# Patient Record
Sex: Male | Born: 1965
Health system: Southern US, Community
[De-identification: ages and names within clinical notes are randomized; demographics above are authoritative.]

## PROBLEM LIST (undated history)

## (undated) DIAGNOSIS — M5136 Other intervertebral disc degeneration, lumbar region: Secondary | ICD-10-CM

## (undated) DIAGNOSIS — G8929 Other chronic pain: Secondary | ICD-10-CM

## (undated) DIAGNOSIS — M51369 Other intervertebral disc degeneration, lumbar region without mention of lumbar back pain or lower extremity pain: Secondary | ICD-10-CM

## (undated) DIAGNOSIS — M5126 Other intervertebral disc displacement, lumbar region: Secondary | ICD-10-CM

## (undated) DIAGNOSIS — Z87442 Personal history of urinary calculi: Secondary | ICD-10-CM

---

## 2004-01-23 ENCOUNTER — Emergency Department: Payer: Self-pay | Admitting: Internal Medicine

## 2005-09-19 ENCOUNTER — Emergency Department: Payer: Self-pay | Admitting: Emergency Medicine

## 2009-07-03 ENCOUNTER — Emergency Department: Payer: Self-pay | Admitting: Emergency Medicine

## 2009-09-18 ENCOUNTER — Emergency Department: Payer: Self-pay | Admitting: Emergency Medicine

## 2012-07-01 ENCOUNTER — Ambulatory Visit: Payer: Self-pay | Admitting: Family Medicine

## 2012-07-08 ENCOUNTER — Ambulatory Visit: Payer: Self-pay | Admitting: Family Medicine

## 2014-07-25 ENCOUNTER — Ambulatory Visit
Admission: EM | Admit: 2014-07-25 | Discharge: 2014-07-25 | Disposition: A | Discharge status: Home / Self Care | Payer: PRIVATE HEALTH INSURANCE

## 2015-07-07 ENCOUNTER — Ambulatory Visit (INDEPENDENT_AMBULATORY_CARE_PROVIDER_SITE_OTHER): Payer: Self-pay | Admitting: Family Medicine

## 2015-07-07 ENCOUNTER — Encounter: Payer: Self-pay | Admitting: Family Medicine

## 2015-07-07 VITALS — BP 122/80 | HR 77 | Temp 98.3°F | Resp 16 | Ht 70.0 in | Wt 189.0 lb

## 2015-07-07 DIAGNOSIS — J40 Bronchitis, not specified as acute or chronic: Secondary | ICD-10-CM

## 2015-07-07 MED ORDER — AMOXICILLIN 500 MG PO CAPS: 500.0000 mg | ORAL_CAPSULE | Freq: Three times a day (TID) | ORAL | Status: DC

## 2015-07-07 NOTE — Progress Notes (Signed)
Name: Maxwell [name]   MRN: [id number]    DOB: October 21, 1965   Date:07/07/2015       Progress Note  Subjective  Chief Complaint  Chief Complaint  Patient presents with  . Cough    started with swollen and sore throat now congested and PND and sneezing pretty bad since Tuesday. x4 days Had Fever 102 denies NVD    Cough This is a new problem. The current episode started in the past 7 days. The problem has been gradually worsening. The cough is non-productive. Associated symptoms include headaches, nasal congestion, postnasal drip, rhinorrhea and sweats. Pertinent negatives include no chest pain, chills, ear congestion, ear pain, fever, heartburn, hemoptysis, myalgias, rash, sore throat, shortness of breath, weight loss or wheezing. Nothing aggravates the symptoms. He has tried OTC cough suppressant for the symptoms. The treatment provided mild relief. There is no history of asthma, bronchiectasis, bronchitis, COPD or environmental allergies.    No problem-specific assessment & plan notes found for this encounter.   History reviewed. No pertinent past medical history.  History reviewed. No pertinent past surgical history.  Family History  Problem Relation Age of Onset  . Family history unknown: Yes    Social History   Social History  . Marital Status: Married    Spouse Name: N/A  . Number of Children: N/A  . Years of Education: N/A   Occupational History  . Not on file.   Social History Main Topics  . Smoking status: Current Some Day Smoker  . Smokeless tobacco: Never Used  . Alcohol Use: 1.2 oz/week    2 Cans of beer per week  . Drug Use: No  . Sexual Activity: Not on file   Other Topics Concern  . Not on file   Social History Narrative  . No narrative on file    No Known Allergies   Review of Systems  Constitutional: Negative for fever, chills, weight loss and malaise/fatigue.  HENT: Positive for postnasal drip and rhinorrhea. Negative for ear discharge, ear  pain and sore throat.   Eyes: Negative for blurred vision.  Respiratory: Positive for cough. Negative for hemoptysis, sputum production, shortness of breath and wheezing.   Cardiovascular: Negative for chest pain, palpitations and leg swelling.  Gastrointestinal: Negative for heartburn, nausea, abdominal pain, diarrhea, constipation, blood in stool and melena.  Genitourinary: Negative for dysuria, urgency, frequency and hematuria.  Musculoskeletal: Negative for myalgias, back pain, joint pain and neck pain.  Skin: Negative for rash.  Neurological: Positive for headaches. Negative for dizziness, tingling, sensory change and focal weakness.  Endo/Heme/Allergies: Negative for environmental allergies and polydipsia. Does not bruise/bleed easily.  Psychiatric/Behavioral: Negative for depression and suicidal ideas. The patient is not nervous/anxious and does not have insomnia.      Objective  Filed Vitals:   07/07/15 1106  BP: 122/80  Pulse: 77  Temp: 98.3 F (36.8 C)  TempSrc: Oral  Resp: 16  Height:  (1.778 m)  Weight: 189 lb (85.73 kg)  SpO2: 97%    Physical Exam  Constitutional: He is oriented to person, place, and time and well-developed, well-nourished, and in no distress.  HENT:  Head: Normocephalic.  Right Ear: External ear normal.  Left Ear: External ear normal.  Nose: Nose normal.  Mouth/Throat: Oropharynx is clear and moist.  Eyes: Conjunctivae and EOM are normal. Pupils are equal, round, and reactive to light. Right eye exhibits no discharge. Left eye exhibits no discharge. No scleral icterus.  Neck: Normal range of motion.  Neck supple. No JVD present. No tracheal deviation present. No thyromegaly present.  Cardiovascular: Normal rate, regular rhythm, normal heart sounds and intact distal pulses.  Exam reveals no gallop and no friction rub.   No murmur heard. Pulmonary/Chest: Breath sounds normal. No respiratory distress. He has no wheezes. He has no rales.   Abdominal: Soft. Bowel sounds are normal. He exhibits no mass. There is no hepatosplenomegaly. There is no tenderness. There is no rebound, no guarding and no CVA tenderness.  Musculoskeletal: Normal range of motion. He exhibits no edema or tenderness.  Lymphadenopathy:    He has no cervical adenopathy.  Neurological: He is alert and oriented to person, place, and time. He has normal sensation, normal strength, normal reflexes and intact cranial nerves. No cranial nerve deficit.  Skin: Skin is warm. No rash noted.  Psychiatric: Mood and affect normal.  Nursing note and vitals reviewed.     Assessment & Plan  Problem List Items Addressed This Visit    None    Visit Diagnoses    Bronchitis    -  Primary    Relevant Medications    amoxicillin (AMOXIL) 500 MG capsule         Dr. Hayden Rasmusseneanna Marna Weniger [facility]  07/07/2015

## 2016-06-07 ENCOUNTER — Encounter: Payer: Self-pay | Admitting: Family Medicine

## 2016-06-07 ENCOUNTER — Ambulatory Visit (INDEPENDENT_AMBULATORY_CARE_PROVIDER_SITE_OTHER): Payer: BLUE CROSS/BLUE SHIELD | Admitting: Family Medicine

## 2016-06-07 VITALS — BP 100/58 | HR 80 | Ht 70.0 in | Wt 181.0 lb

## 2016-06-07 DIAGNOSIS — R002 Palpitations: Secondary | ICD-10-CM | POA: Diagnosis not present

## 2016-06-07 DIAGNOSIS — R000 Tachycardia, unspecified: Secondary | ICD-10-CM | POA: Diagnosis not present

## 2016-06-07 NOTE — Progress Notes (Addendum)
Name: Maxwell [name]   MRN: [id number]    DOB: 1966/02/09   Date:06/07/2016       Progress Note  Subjective  Chief Complaint  Chief Complaint  Patient presents with  . Palpitations    having a "fluttering heartbeat, started about 3 weeks ago out of the blue"- does not have to be doing anything and it will happen "several times a day"- also having headaches. Has not checked his b/p    Palpitations   This is a new problem. The current episode started 1 to 4 weeks ago. The problem occurs daily. The problem has been waxing and waning. On average, each episode lasts 10 seconds. Nothing aggravates the symptoms. Pertinent negatives include no anxiety, chest fullness, chest pain, coughing, diaphoresis, dizziness, fever, irregular heartbeat, malaise/fatigue, nausea, near-syncope, shortness of breath, syncope or weakness. He has tried nothing for the symptoms. There are no known risk factors. There is no history of anemia, anxiety, heart disease, hyperthyroidism or a valve disorder.    No problem-specific Assessment & Plan notes found for this encounter.   History reviewed. No pertinent past medical history.  History reviewed. No pertinent surgical history.  Family History  Problem Relation Age of Onset  . Family history unknown: Yes    Social History   Social History  . Marital status: Married    Spouse name: N/A  . Number of children: N/A  . Years of education: N/A   Occupational History  . Not on file.   Social History Main Topics  . Smoking status: Current Some Day Smoker    Types: Cigars  . Smokeless tobacco: Current User    Types: Snuff     Comment: counseled concerning meds and patches  . Alcohol use 1.2 oz/week    2 Cans of beer per week  . Drug use: No  . Sexual activity: Not on file   Other Topics Concern  . Not on file   Social History Narrative  . No narrative on file    No Known Allergies  Outpatient Medications Prior to Visit  Medication Sig Dispense  Refill  . amoxicillin (AMOXIL) 500 MG capsule Take 1 capsule (500 mg total) by mouth 3 (three) times daily. 30 capsule 0   No facility-administered medications prior to visit.     Review of Systems  Constitutional: Negative for chills, diaphoresis, fever, malaise/fatigue and weight loss.  HENT: Negative for ear discharge, ear pain and sore throat.   Eyes: Negative for blurred vision.  Respiratory: Negative for cough, sputum production, shortness of breath and wheezing.   Cardiovascular: Positive for palpitations. Negative for chest pain, leg swelling, syncope and near-syncope.  Gastrointestinal: Negative for abdominal pain, blood in stool, constipation, diarrhea, heartburn, melena and nausea.  Genitourinary: Negative for dysuria, frequency, hematuria and urgency.  Musculoskeletal: Negative for back pain, joint pain, myalgias and neck pain.  Skin: Negative for rash.  Neurological: Negative for dizziness, tingling, sensory change, focal weakness, weakness and headaches.  Endo/Heme/Allergies: Negative for environmental allergies and polydipsia. Does not bruise/bleed easily.  Psychiatric/Behavioral: Negative for depression and suicidal ideas. The patient is not nervous/anxious and does not have insomnia.      Objective  Vitals:   06/07/16 1021  BP: (!) 100/58  Pulse: 80  Weight: 181 lb (82.1 kg)  Height:  (1.778 m)    Physical Exam  Constitutional: He is oriented to person, place, and time and well-developed, well-nourished, and in no distress.  HENT:  Head: Normocephalic.  Right Ear:  External ear normal.  Left Ear: External ear normal.  Nose: Nose normal.  Mouth/Throat: Oropharynx is clear and moist.  Eyes: Conjunctivae and EOM are normal. Pupils are equal, round, and reactive to light. Right eye exhibits no discharge. Left eye exhibits no discharge. No scleral icterus.  Neck: Normal range of motion. Neck supple. No JVD present. No tracheal deviation present. No  thyromegaly present.  Cardiovascular: Normal rate, regular rhythm, normal heart sounds and intact distal pulses.  Exam reveals no gallop and no friction rub.   No murmur heard. Pulmonary/Chest: Breath sounds normal. No respiratory distress. He has no wheezes. He has no rales.  Abdominal: Soft. Bowel sounds are normal. He exhibits no mass. There is no hepatosplenomegaly. There is no tenderness. There is no rebound, no guarding and no CVA tenderness.  Musculoskeletal: Normal range of motion. He exhibits no edema or tenderness.  Lymphadenopathy:    He has no cervical adenopathy.  Neurological: He is alert and oriented to person, place, and time. He has normal sensation, normal strength, normal reflexes and intact cranial nerves. No cranial nerve deficit.  Skin: Skin is warm. No rash noted.  Psychiatric: Mood and affect normal.  Nursing note and vitals reviewed.     Assessment & Plan  Problem List Items Addressed This Visit    None    Visit Diagnoses    Heart palpitations    -  Primary   Relevant Orders   EKG 12-Lead (Completed)   Ambulatory referral to Cardiology   TSH   Renal Function Panel   Tachycardia, unspecified       Relevant Orders   Ambulatory referral to Cardiology   TSH   Renal Function Panel      No orders of the defined types were placed in this encounter.     Dr. Hayden [facility]  06/07/16

## 2016-06-10 DIAGNOSIS — R002 Palpitations: Secondary | ICD-10-CM | POA: Diagnosis not present

## 2016-06-12 ENCOUNTER — Other Ambulatory Visit: Payer: Self-pay

## 2016-06-13 ENCOUNTER — Other Ambulatory Visit: Payer: Self-pay

## 2016-06-17 DIAGNOSIS — R002 Palpitations: Secondary | ICD-10-CM | POA: Diagnosis not present

## 2016-10-07 ENCOUNTER — Ambulatory Visit: Payer: Self-pay | Admitting: Family Medicine

## 2016-12-31 ENCOUNTER — Encounter: Payer: Self-pay | Admitting: Family Medicine

## 2016-12-31 ENCOUNTER — Ambulatory Visit: Payer: BLUE CROSS/BLUE SHIELD | Admitting: Family Medicine

## 2016-12-31 VITALS — BP 120/70 | HR 64 | Ht 70.0 in | Wt 179.0 lb

## 2016-12-31 DIAGNOSIS — N529 Male erectile dysfunction, unspecified: Secondary | ICD-10-CM | POA: Diagnosis not present

## 2016-12-31 DIAGNOSIS — M51369 Other intervertebral disc degeneration, lumbar region without mention of lumbar back pain or lower extremity pain: Secondary | ICD-10-CM

## 2016-12-31 DIAGNOSIS — M5136 Other intervertebral disc degeneration, lumbar region: Secondary | ICD-10-CM

## 2016-12-31 MED ORDER — MELOXICAM 15 MG PO TABS: 15.0000 mg | ORAL_TABLET | Freq: Every day | ORAL | 0 refills | Status: DC

## 2016-12-31 MED ORDER — CYCLOBENZAPRINE HCL 10 MG PO TABS: 10.0000 mg | ORAL_TABLET | Freq: Three times a day (TID) | ORAL | 0 refills | Status: DC | PRN

## 2016-12-31 MED ORDER — PREDNISONE 10 MG PO TABS: 10.0000 mg | ORAL_TABLET | Freq: Every day | ORAL | 0 refills | Status: DC

## 2016-12-31 MED ORDER — TADALAFIL 20 MG PO TABS: 20.0000 mg | ORAL_TABLET | Freq: Every day | ORAL | 0 refills | Status: DC | PRN

## 2016-12-31 NOTE — Patient Instructions (Signed)
Herniated Disk A herniated disk, also called a ruptured disk or slipped disk, occurs when a disk in the spine bulges out too far. Between the bones in the spine (vertebrae), there are oval disks that are made of a soft, spongy center that is surrounded by a tough outer ring. The disks connect your vertebrae, help your spine move, and absorb shocks from your movement. When you have a herniated disk, the spongy center of the disk bulges out or breaks through the outer ring. It can press on a nerve between the vertebrae and cause pain. This can occur anywhere in the back or neck area, but the lower back is most commonly affected. What are the causes? This condition may be caused by:  Age-related wear and tear. The spongy centers of spinal disks tend to shrink and dry out with age, which makes them more likely to herniate.  Sudden injury, such as a strain or sprain.  What increases the risk? Aging is the main risk factor for a herniated disk. Other risk factors include:  Being a man who is 30-50 years old.  Frequently doing activities that involve heavy lifting, bending, or twisting.  Frequently driving for long hours at a time.  Not getting enough exercise.  Being overweight.  Smoking.  Having a family history of back problems or herniated disks.  Being pregnant or giving birth.  Having poor nutrition.  Being tall.  What are the signs or symptoms? Symptoms may vary depending on where your herniated disk is located.  A herniated disk in the lower back may cause sharp pain in: ? Part of the arm, leg, hip, or buttocks. ? The back of the lower leg (calf). ? The lower back, spreading down through the leg into the foot (sciatica).  A herniated disk in the neck may cause dizziness and vertigo. It may also cause pain or weakness in: ? The neck. ? The shoulder blades. ? Upper arm, forearm, or fingers.  You may also have muscle weakness. It may be difficult to: ? Lift your leg or  arm. ? Stand on your toes. ? Squeeze tightly with one of your hands.  Other symptoms may include: ? Numbness or tingling in the affected areas of the hands, arms, feet, or legs. ? Inability to control when you urinate or when you have bowel movements. This is a rare but serious sign of a severe herniated disk in the lower back.  How is this diagnosed? This condition may be diagnosed based on:  Your symptoms.  Your medical history.  A physical exam. The exam may include: ? Straight-leg test. You will lie on your back while your health care provider lifts your leg, keeping your knee straight. If you feel pain, you likely have a herniated disk. ? Neurological tests. This includes checking for numbness, reflexes, muscle strength, and posture.  Imaging tests, such as: ? X-rays. ? MRI. ? CT scan. ? Electromyogram (EMG) to check the nerves that control muscles. This test may be used to determine which nerves are affected by your herniated disk.  How is this treated? Treatment for this condition may include:  A short period of rest. This is usually the first treatment. ? You may be on bed rest for up to 2 days, or you may be instructed to stay home and avoid physical activity. ? If you have a herniated disk in your lower back, avoid sitting as much as possible. Sitting increases pressure on the disk.  Medicines. These may   include: ? NSAIDs to help reduce pain and swelling. ? Muscle relaxants to prevent sudden tightening of the back muscles (back spasms). ? Prescription pain medicines, if you have severe pain.  Steroid injections in the area of the herniated disk. This can help reduce pain and swelling.  Physical therapy to strengthen your back muscles.  In many cases, symptoms go away with treatment over a period of days or weeks. You will most likely be free of symptoms after 3-4 months. If other treatments do not help to relieve your symptoms, you may need surgery. Follow these  instructions at home: Medicines  Take over-the-counter and prescription medicines only as told by your health care provider.  Do not drive or use heavy machinery while taking prescription pain medicine. Activity  Rest as directed.  After your rest period: ? Return to your normal activities and gradually begin exercising as told by your health care provider. Ask your health care provider what activities and exercises are safe for you. ? Use good posture. ? Avoid movements that cause pain. ? Do not lift anything that is heavier than 10 lb (4.5 kg) until your health care provider says this is safe. ? Do not sit or stand for long periods of time without changing positions. ? Do not sit for long periods of time without getting up and moving around.  If physical therapy was prescribed, do exercises as instructed.  Aim to strengthen muscles in your back and abdomen with exercises like crunches, swimming, or walking. General instructions  Do not use any products that contain nicotine or tobacco, such as cigarettes and e-cigarettes. These products can delay healing. If you need help quitting, ask your health care provider.  Do not wear high-heeled shoes.  Do not sleep on your belly.  If you are overweight, work with your health care provider to lose weight safely.  To prevent or treat constipation while you are taking prescription pain medicine, your health care provider may recommend that you: ? Drink enough fluid to keep your urine clear or pale yellow. ? Take over-the-counter or prescription medicines. ? Eat foods that are high in fiber, such as fresh fruits and vegetables, whole grains, and beans. ? Limit foods that are high in fat and processed sugars, such as fried and sweet foods.  Keep all follow-up visits as told by your health care provider. This is important. How is this prevented?  Maintain a healthy weight.  Try to avoid stressful situations.  Maintain physical  fitness. Do at least 150 minutes of moderate-intensity exercise each week, such as brisk walking or water aerobics.  When lifting objects: ? Keep your feet at least shoulder-width apart and tighten your abdominal muscles. ? Keep your spine neutral as you bend your knees and hips. It is important to lift using the strength of your legs, not your back. Do not lock your knees straight out. ? Always ask for help to lift heavy or awkward objects. Contact a health care provider if:  You have back pain or neck pain that does not get better after 6 weeks.  You have severe pain in your back, neck, legs, or arms.  You develop numbness, tingling, or weakness in any part of your body. Get help right away if:  You cannot move your arms or legs.  You cannot control when you urinate or have bowel movements.  You feel dizzy or you faint.  You have shortness of breath. This information is not intended to replace advice given   to you by your health care provider. Make sure you discuss any questions you have with your health care provider. Document Released: 01/26/2000 Document Revised: 09/25/2015 Document Reviewed: 09/25/2015 Elsevier Interactive Patient Education  2017 [employer]  

## 2016-12-31 NOTE — Progress Notes (Signed)
Name: Maxwell [name]   MRN: [id number]    DOB: 21-Jan-1966   Date:12/31/2016       Progress Note  Subjective  Chief Complaint  Chief Complaint  Patient presents with  . Back Pain    Back Pain  This is a recurrent problem. The current episode started in the past 7 days. The problem occurs intermittently. The problem has been gradually worsening since onset. The pain is present in the lumbar spine. The quality of the pain is described as aching. The pain does not radiate. The pain is at a severity of 5/10. The pain is moderate. The symptoms are aggravated by bending and twisting (lifting). Stiffness is present in the morning. Pertinent negatives include no abdominal pain, bladder incontinence, bowel incontinence, chest pain, dysuria, fever, headaches, leg pain, numbness, paresis, paresthesias, tingling, weakness or weight loss. Risk factors: weightlifter. He has tried NSAIDs for the symptoms. The treatment provided no relief.    No problem-specific Assessment & Plan notes found for this encounter.   No past medical history on file.  No past surgical history on file.  Family History  Family history unknown: Yes    Social History   Socioeconomic History  . Marital status: Married    Spouse name: Not on file  . Number of children: Not on file  . Years of education: Not on file  . Highest education level: Not on file  Social Needs  . Financial resource strain: Not on file  . Food insecurity - worry: Not on file  . Food insecurity - inability: Not on file  . Transportation needs - medical: Not on file  . Transportation needs - non-medical: Not on file  Occupational History  . Not on file  Tobacco Use  . Smoking status: Current Some Day Smoker    Types: Cigars  . Smokeless tobacco: Current User    Types: Snuff  . Tobacco comment: counseled concerning meds and patches  Substance and Sexual Activity  . Alcohol use: Yes    Alcohol/week: 1.2 oz    Types: 2 Cans of beer per week   . Drug use: No  . Sexual activity: Not on file  Other Topics Concern  . Not on file  Social History Narrative  . Not on file    No Known Allergies  No outpatient medications prior to visit.   No facility-administered medications prior to visit.     Review of Systems  Constitutional: Negative for chills, fever, malaise/fatigue and weight loss.  HENT: Negative for ear discharge, ear pain and sore throat.   Eyes: Negative for blurred vision.  Respiratory: Negative for cough, sputum production, shortness of breath and wheezing.   Cardiovascular: Negative for chest pain, palpitations and leg swelling.  Gastrointestinal: Negative for abdominal pain, blood in stool, bowel incontinence, constipation, diarrhea, heartburn, melena and nausea.  Genitourinary: Negative for bladder incontinence, dysuria, frequency, hematuria and urgency.  Musculoskeletal: Positive for back pain. Negative for joint pain, myalgias and neck pain.  Skin: Negative for rash.  Neurological: Negative for dizziness, tingling, sensory change, focal weakness, weakness, numbness, headaches and paresthesias.  Endo/Heme/Allergies: Negative for environmental allergies and polydipsia. Does not bruise/bleed easily.  Psychiatric/Behavioral: Negative for depression and suicidal ideas. The patient is not nervous/anxious and does not have insomnia.      Objective  Vitals:   12/31/16 1043  BP: 120/70  Pulse: 64  Weight: 179 lb (81.2 kg)  Height: 5\' 10"  (1.778 m)    Physical Exam  Constitutional: He  is oriented to person, place, and time and well-developed, well-nourished, and in no distress.  HENT:  Head: Normocephalic.  Right Ear: External ear normal.  Left Ear: External ear normal.  Nose: Nose normal.  Mouth/Throat: Oropharynx is clear and moist.  Eyes: Conjunctivae and EOM are normal. Pupils are equal, round, and reactive to light. Right eye exhibits no discharge. Left eye exhibits no discharge. No scleral icterus.   Neck: Normal range of motion. Neck supple. No JVD present. No tracheal deviation present. No thyromegaly present.  Cardiovascular: Normal rate, regular rhythm, normal heart sounds and intact distal pulses. Exam reveals no gallop and no friction rub.  No murmur heard. Pulmonary/Chest: Breath sounds normal. No respiratory distress. He has no wheezes. He has no rales.  Abdominal: Soft. Bowel sounds are normal. He exhibits no mass. There is no hepatosplenomegaly. There is no tenderness. There is no rebound, no guarding and no CVA tenderness.  Musculoskeletal: He exhibits no edema.       Lumbar back: He exhibits decreased range of motion, tenderness and spasm. He exhibits no bony tenderness and no swelling.  Lymphadenopathy:    He has no cervical adenopathy.  Neurological: He is alert and oriented to person, place, and time. He has normal sensation, normal strength, normal reflexes and intact cranial nerves. No cranial nerve deficit.  Skin: Skin is warm. No rash noted.  Psychiatric: Mood and affect normal.  Nursing note and vitals reviewed.     Assessment & Plan  Problem List Items Addressed This Visit    None    Visit Diagnoses    Degenerative disc disease, lumbar    -  Primary   Relevant Medications   meloxicam (MOBIC) 15 MG tablet   cyclobenzaprine (FLEXERIL) 10 MG tablet   predniSONE (DELTASONE) 10 MG tablet   Vasculogenic erectile dysfunction, unspecified vasculogenic erectile dysfunction type       Relevant Medications   tadalafil (CIALIS) 20 MG tablet      Meds ordered this encounter  Medications  . meloxicam (MOBIC) 15 MG tablet    Sig: Take 1 tablet (15 mg total) by mouth daily.    Dispense:  30 tablet    Refill:  0  . cyclobenzaprine (FLEXERIL) 10 MG tablet    Sig: Take 1 tablet (10 mg total) by mouth 3 (three) times daily as needed for muscle spasms.    Dispense:  30 tablet    Refill:  0  . predniSONE (DELTASONE) 10 MG tablet    Sig: Take 1 tablet (10 mg total) by  mouth daily with breakfast.    Dispense:  30 tablet    Refill:  0    4,4,4,3,3,3,2,2,2,1,1,1  . tadalafil (CIALIS) 20 MG tablet    Sig: Take 1 tablet (20 mg total) by mouth daily as needed for erectile dysfunction.    Dispense:  5 tablet    Refill:  0      Dr. [facility]anna Jones [facility]  12/31/16

## 2017-01-06 ENCOUNTER — Other Ambulatory Visit: Payer: Self-pay

## 2017-01-16 ENCOUNTER — Other Ambulatory Visit: Payer: Self-pay

## 2017-01-16 DIAGNOSIS — N529 Male erectile dysfunction, unspecified: Secondary | ICD-10-CM

## 2017-01-16 MED ORDER — TADALAFIL 20 MG PO TABS: 20.0000 mg | ORAL_TABLET | Freq: Every day | ORAL | 0 refills | Status: DC | PRN

## 2017-01-17 ENCOUNTER — Other Ambulatory Visit: Payer: Self-pay

## 2017-01-17 DIAGNOSIS — N529 Male erectile dysfunction, unspecified: Secondary | ICD-10-CM

## 2017-01-17 MED ORDER — TADALAFIL 20 MG PO TABS: 20.0000 mg | ORAL_TABLET | Freq: Every day | ORAL | 0 refills | Status: DC | PRN

## 2017-01-21 ENCOUNTER — Other Ambulatory Visit: Payer: Self-pay

## 2017-01-21 DIAGNOSIS — M5136 Other intervertebral disc degeneration, lumbar region: Secondary | ICD-10-CM

## 2017-01-27 ENCOUNTER — Other Ambulatory Visit: Payer: Self-pay | Admitting: Family Medicine

## 2017-01-27 DIAGNOSIS — M5136 Other intervertebral disc degeneration, lumbar region: Secondary | ICD-10-CM

## 2017-01-29 DIAGNOSIS — M545 Low back pain: Secondary | ICD-10-CM | POA: Diagnosis not present

## 2017-01-29 DIAGNOSIS — M5136 Other intervertebral disc degeneration, lumbar region: Secondary | ICD-10-CM | POA: Diagnosis not present

## 2017-01-29 DIAGNOSIS — G8929 Other chronic pain: Secondary | ICD-10-CM | POA: Diagnosis not present

## 2017-01-29 DIAGNOSIS — M51379 Other intervertebral disc degeneration, lumbosacral region without mention of lumbar back pain or lower extremity pain: Secondary | ICD-10-CM | POA: Insufficient documentation

## 2017-01-29 DIAGNOSIS — M5441 Lumbago with sciatica, right side: Secondary | ICD-10-CM | POA: Diagnosis not present

## 2017-01-31 ENCOUNTER — Telehealth: Payer: Self-pay

## 2017-01-31 NOTE — Telephone Encounter (Signed)
Pt called wanting something else for his back pain- [facility] that he needed to try PT for pain management and then if after 3 weeks no success, he would follow up with [facility] for possible MRI. Had to LM on machine due to pt not answering phone.

## 2017-02-06 DIAGNOSIS — M545 Low back pain: Secondary | ICD-10-CM | POA: Diagnosis not present

## 2017-02-06 DIAGNOSIS — M5136 Other intervertebral disc degeneration, lumbar region: Secondary | ICD-10-CM | POA: Diagnosis not present

## 2017-02-06 DIAGNOSIS — G8929 Other chronic pain: Secondary | ICD-10-CM | POA: Diagnosis not present

## 2017-02-06 DIAGNOSIS — R531 Weakness: Secondary | ICD-10-CM | POA: Diagnosis not present

## 2017-02-13 DIAGNOSIS — M545 Low back pain: Secondary | ICD-10-CM | POA: Diagnosis not present

## 2017-02-13 DIAGNOSIS — G8929 Other chronic pain: Secondary | ICD-10-CM | POA: Diagnosis not present

## 2017-02-13 DIAGNOSIS — M5136 Other intervertebral disc degeneration, lumbar region: Secondary | ICD-10-CM | POA: Diagnosis not present

## 2017-02-13 DIAGNOSIS — R531 Weakness: Secondary | ICD-10-CM | POA: Diagnosis not present

## 2017-02-19 DIAGNOSIS — M5441 Lumbago with sciatica, right side: Secondary | ICD-10-CM | POA: Diagnosis not present

## 2017-02-19 DIAGNOSIS — G8929 Other chronic pain: Secondary | ICD-10-CM | POA: Diagnosis not present

## 2017-02-19 DIAGNOSIS — M545 Low back pain: Secondary | ICD-10-CM | POA: Diagnosis not present

## 2017-02-19 DIAGNOSIS — M5136 Other intervertebral disc degeneration, lumbar region: Secondary | ICD-10-CM | POA: Diagnosis not present

## 2017-02-20 ENCOUNTER — Other Ambulatory Visit: Payer: Self-pay | Admitting: Orthopedic Surgery

## 2017-02-20 DIAGNOSIS — M5136 Other intervertebral disc degeneration, lumbar region: Secondary | ICD-10-CM

## 2017-02-20 DIAGNOSIS — G8929 Other chronic pain: Secondary | ICD-10-CM

## 2017-02-20 DIAGNOSIS — M5441 Lumbago with sciatica, right side: Secondary | ICD-10-CM

## 2017-02-20 DIAGNOSIS — M5137 Other intervertebral disc degeneration, lumbosacral region: Secondary | ICD-10-CM

## 2017-02-20 DIAGNOSIS — M5440 Lumbago with sciatica, unspecified side: Principal | ICD-10-CM

## 2017-02-20 DIAGNOSIS — R531 Weakness: Secondary | ICD-10-CM | POA: Diagnosis not present

## 2017-02-20 DIAGNOSIS — M51379 Other intervertebral disc degeneration, lumbosacral region without mention of lumbar back pain or lower extremity pain: Secondary | ICD-10-CM

## 2017-02-20 DIAGNOSIS — M545 Low back pain: Secondary | ICD-10-CM | POA: Diagnosis not present

## 2017-02-28 ENCOUNTER — Ambulatory Visit
Admission: RE | Admit: 2017-02-28 | Discharge: 2017-02-28 | Disposition: A | Discharge status: Home / Self Care | Payer: BLUE CROSS/BLUE SHIELD | Source: Ambulatory Visit | Attending: Orthopedic Surgery | Admitting: Orthopedic Surgery

## 2017-02-28 DIAGNOSIS — M5137 Other intervertebral disc degeneration, lumbosacral region: Secondary | ICD-10-CM

## 2017-02-28 DIAGNOSIS — M545 Low back pain: Secondary | ICD-10-CM | POA: Diagnosis not present

## 2017-02-28 DIAGNOSIS — M48061 Spinal stenosis, lumbar region without neurogenic claudication: Secondary | ICD-10-CM | POA: Insufficient documentation

## 2017-02-28 DIAGNOSIS — M5126 Other intervertebral disc displacement, lumbar region: Secondary | ICD-10-CM | POA: Insufficient documentation

## 2017-02-28 DIAGNOSIS — G8929 Other chronic pain: Secondary | ICD-10-CM | POA: Diagnosis not present

## 2017-02-28 DIAGNOSIS — M5136 Other intervertebral disc degeneration, lumbar region: Secondary | ICD-10-CM

## 2017-02-28 DIAGNOSIS — M5441 Lumbago with sciatica, right side: Secondary | ICD-10-CM

## 2017-02-28 DIAGNOSIS — M5440 Lumbago with sciatica, unspecified side: Secondary | ICD-10-CM

## 2017-03-06 DIAGNOSIS — M5416 Radiculopathy, lumbar region: Secondary | ICD-10-CM | POA: Diagnosis not present

## 2017-04-30 ENCOUNTER — Inpatient Hospital Stay: Admission: RE | Admit: 2017-04-30 | Payer: BLUE CROSS/BLUE SHIELD | Source: Ambulatory Visit

## 2017-05-01 ENCOUNTER — Inpatient Hospital Stay: Admission: RE | Admit: 2017-05-01 | Payer: BLUE CROSS/BLUE SHIELD | Source: Ambulatory Visit

## 2017-05-07 ENCOUNTER — Ambulatory Visit: Admission: RE | Admit: 2017-05-07 | Payer: BLUE CROSS/BLUE SHIELD | Source: Ambulatory Visit | Admitting: Neurosurgery

## 2017-05-07 ENCOUNTER — Encounter: Admission: RE | Payer: Self-pay | Source: Ambulatory Visit

## 2017-05-07 SURGERY — LUMBAR LAMINECTOMY/DECOMPRESSION MICRODISCECTOMY 1 LEVEL
Anesthesia: Choice | Laterality: Right

## 2017-05-12 ENCOUNTER — Other Ambulatory Visit: Payer: Self-pay | Admitting: Family Medicine

## 2017-05-12 DIAGNOSIS — M5136 Other intervertebral disc degeneration, lumbar region: Secondary | ICD-10-CM

## 2017-05-13 ENCOUNTER — Encounter: Payer: Self-pay | Admitting: Family Medicine

## 2017-05-13 ENCOUNTER — Ambulatory Visit: Payer: BLUE CROSS/BLUE SHIELD | Admitting: Family Medicine

## 2017-05-13 VITALS — BP 128/64 | HR 80 | Ht 70.0 in | Wt 193.0 lb

## 2017-05-13 DIAGNOSIS — M940 Chondrocostal junction syndrome [Tietze]: Secondary | ICD-10-CM

## 2017-05-13 DIAGNOSIS — R079 Chest pain, unspecified: Secondary | ICD-10-CM | POA: Diagnosis not present

## 2017-05-13 MED ORDER — MELOXICAM 15 MG PO TABS
15.0000 mg | ORAL_TABLET | Freq: Every day | ORAL | 0 refills | Status: DC
Start: 2017-05-13 — End: 2017-05-28

## 2017-05-13 NOTE — Progress Notes (Signed)
Name: Maxwell [name]   MRN: [id number]    DOB: Mar 01, 1965   Date:05/13/2017       Progress Note  Subjective  Chief Complaint  Chief Complaint  Patient presents with  . Chest Pain    started approx 40 min ago at work- discribed as a sharp, poking pain, 7/10, R) upper chest. Has not taken any meds.     Chest Pain   This is a new problem. The current episode started today. The onset quality is sudden. The problem occurs intermittently (every few minutes). The problem has been waxing and waning (intermitant). The pain is present in the substernal region (right costochondral margin). The pain is at a severity of 5/10. The pain is moderate. The quality of the pain is described as sharp. Associated symptoms include diaphoresis. Pertinent negatives include no abdominal pain, back pain, cough, dizziness, exertional chest pressure, fever, headaches, irregular heartbeat, leg pain, lower extremity edema, malaise/fatigue, nausea, near-syncope, palpitations, shortness of breath or sputum production. The pain is aggravated by nothing. He has tried nothing for the symptoms. Risk factors include smoking/tobacco exposure.  Pertinent negatives for past medical history include no CAD and no MI. Prior diagnostic workup includes echocardiogram.    No problem-specific Assessment & Plan notes found for this encounter.   History reviewed. No pertinent past medical history.  History reviewed. No pertinent surgical history.  Family History  Family history unknown: Yes    Social History   Socioeconomic History  . Marital status: Married    Spouse name: Not on file  . Number of children: Not on file  . Years of education: Not on file  . Highest education level: Not on file  Occupational History  . Not on file  Social Needs  . Financial resource strain: Not on file  . Food insecurity:    Worry: Not on file    Inability: Not on file  . Transportation needs:    Medical: Not on file    Non-medical: Not on  file  Tobacco Use  . Smoking status: Current Some Day Smoker    Types: Cigars  . Smokeless tobacco: Current User    Types: Snuff  . Tobacco comment: counseled concerning meds and patches  Substance and Sexual Activity  . Alcohol use: Yes    Alcohol/week: 1.2 oz    Types: 2 Cans of beer per week  . Drug use: No  . Sexual activity: Not on file  Lifestyle  . Physical activity:    Days per week: Not on file    Minutes per session: Not on file  . Stress: Not on file  Relationships  . Social connections:    Talks on phone: Not on file    Gets together: Not on file    Attends religious service: Not on file    Active member of club or organization: Not on file    Attends meetings of clubs or organizations: Not on file    Relationship status: Not on file  . Intimate partner violence:    Fear of current or ex partner: Not on file    Emotionally abused: Not on file    Physically abused: Not on file    Forced sexual activity: Not on file  Other Topics Concern  . Not on file  Social History Narrative  . Not on file    No Known Allergies  Outpatient Medications Prior to Visit  Medication Sig Dispense Refill  . cyclobenzaprine (FLEXERIL) 10 MG tablet Take 1  tablet (10 mg total) by mouth 3 (three) times daily as needed for muscle spasms. 30 tablet 0  . meloxicam (MOBIC) 15 MG tablet TAKE 1 TABLET BY MOUTH EVERY DAY (Patient not taking: Reported on 04/25/2017) 30 tablet 0  . Multiple Vitamins-Minerals (MULTIVITAMIN PO) Take 1 tablet by mouth daily.    . naproxen sodium (ALEVE) 220 MG tablet Take 440 mg by mouth daily as needed (for pain or headache).    . tadalafil (CIALIS) 20 MG tablet Take 1 tablet (20 mg total) by mouth daily as needed for erectile dysfunction. 18 tablet 0   No facility-administered medications prior to visit.     Review of Systems  Constitutional: Positive for diaphoresis. Negative for chills, fever, malaise/fatigue and weight loss.  HENT: Negative for ear  discharge, ear pain and sore throat.   Eyes: Negative for blurred vision.  Respiratory: Negative for cough, sputum production, shortness of breath and wheezing.   Cardiovascular: Positive for chest pain. Negative for palpitations, leg swelling and near-syncope.  Gastrointestinal: Negative for abdominal pain, blood in stool, constipation, diarrhea, heartburn, melena and nausea.  Genitourinary: Negative for dysuria, frequency, hematuria and urgency.  Musculoskeletal: Negative for back pain, joint pain, myalgias and neck pain.  Skin: Negative for rash.  Neurological: Negative for dizziness, tingling, sensory change, focal weakness and headaches.  Endo/Heme/Allergies: Negative for environmental allergies and polydipsia. Does not bruise/bleed easily.  Psychiatric/Behavioral: Negative for depression and suicidal ideas. The patient is not nervous/anxious and does not have insomnia.      Objective  Vitals:   05/13/17 1629  BP: 128/64  Pulse: 80  Weight: 193 lb (87.5 kg)  Height: 5\' 10"  (1.778 m)    Physical Exam  Constitutional: He is oriented to person, place, and time and well-developed, well-nourished, and in no distress.  HENT:  Head: Normocephalic.  Right Ear: External ear normal.  Left Ear: External ear normal.  Nose: Nose normal.  Mouth/Throat: Oropharynx is clear and moist.  Eyes: Pupils are equal, round, and reactive to light. Conjunctivae and EOM are normal. Right eye exhibits no discharge. Left eye exhibits no discharge. No scleral icterus.  Neck: Normal range of motion. Neck supple. No JVD present. No tracheal deviation present. No thyromegaly present.  Cardiovascular: Normal rate, regular rhythm, normal heart sounds and intact distal pulses. Exam reveals no gallop and no friction rub.  No murmur heard. Pulmonary/Chest: Breath sounds normal. No respiratory distress. He has no wheezes. He has no rales. He exhibits tenderness.  Tenderness along 4th ,5th, and 6th costochondral  margins  Abdominal: Soft. Bowel sounds are normal. He exhibits no mass. There is no hepatosplenomegaly. There is no tenderness. There is no rebound, no guarding and no CVA tenderness.  Musculoskeletal: Normal range of motion. He exhibits no edema or tenderness.  Lymphadenopathy:    He has no cervical adenopathy.  Neurological: He is alert and oriented to person, place, and time. He has normal sensation, normal strength, normal reflexes and intact cranial nerves. No cranial nerve deficit.  Skin: Skin is warm. No rash noted.  Psychiatric: Mood and affect normal.  Nursing note and vitals reviewed.     Assessment & Plan  Problem List Items Addressed This Visit    None    Visit Diagnoses    Chest pain at rest    -  Primary   call 911/to er if persists or increases intensity or duration or change in nature of pain   Relevant Orders   EKG 12-Lead (Completed)  Costochondritis, acute       Relevant Medications   meloxicam (MOBIC) 15 MG tablet      Meds ordered this encounter  Medications  . meloxicam (MOBIC) 15 MG tablet    Sig: Take 1 tablet (15 mg total) by mouth daily.    Dispense:  30 tablet    Refill:  0      Dr. Hayden [facility]  05/13/17

## 2017-05-13 NOTE — Patient Instructions (Signed)
Costochondritis Costochondritis is swelling and irritation (inflammation) of the tissue (cartilage) that connects your ribs to your breastbone (sternum). This causes pain in the front of your chest. The pain usually starts gradually and involves more than one rib. What are the causes? The exact cause of this condition is not always known. It results from stress on the cartilage where your ribs attach to your sternum. The cause of this stress could be:  Chest injury (trauma).  Exercise or activity, such as lifting.  Severe coughing.  What increases the risk? You may be at higher risk for this condition if you:  Are male.  Are 30?51 years old.  Recently started a new exercise or work activity.  Have low levels of vitamin D.  Have a condition that makes you cough frequently.  What are the signs or symptoms? The main symptom of this condition is chest pain. The pain:  Usually starts gradually and can be sharp or dull.  Gets worse with deep breathing, coughing, or exercise.  Gets better with rest.  May be worse when you press on the sternum-rib connection (tenderness).  How is this diagnosed? This condition is diagnosed based on your symptoms, medical history, and a physical exam. Your health care provider will check for tenderness when pressing on your sternum. This is the most important finding. You may also have tests to rule out other causes of chest pain. These may include:  A chest X-ray to check for lung problems.  An electrocardiogram (ECG) to see if you have a heart problem that could be causing the pain.  An imaging scan to rule out a chest or rib fracture.  How is this treated? This condition usually goes away on its own over time. Your health care provider may prescribe an NSAID to reduce pain and inflammation. Your health care provider may also suggest that you:  Rest and avoid activities that make pain worse.  Apply heat or cold to the area to reduce pain  and inflammation.  Do exercises to stretch your chest muscles.  If these treatments do not help, your health care provider may inject a numbing medicine at the sternum-rib connection to help relieve the pain. Follow these instructions at home:  Avoid activities that make pain worse. This includes any activities that use chest, abdominal, and side muscles.  If directed, put ice on the painful area: ? Put ice in a plastic bag. ? Place a towel between your skin and the bag. ? Leave the ice on for 20 minutes, 2-3 times a day.  If directed, apply heat to the affected area as often as told by your health care provider. Use the heat source that your health care provider recommends, such as a moist heat pack or a heating pad. ? Place a towel between your skin and the heat source. ? Leave the heat on for 20-30 minutes. ? Remove the heat if your skin turns bright red. This is especially important if you are unable to feel pain, heat, or cold. You may have a greater risk of getting burned.  Take over-the-counter and prescription medicines only as told by your health care provider.  Return to your normal activities as told by your health care provider. Ask your health care provider what activities are safe for you.  Keep all follow-up visits as told by your health care provider. This is important. Contact a health care provider if:  You have chills or a fever.  Your pain does not go   away or it gets worse.  You have a cough that does not go away (is persistent). Get help right away if:  You have shortness of breath. This information is not intended to replace advice given to you by your health care provider. Make sure you discuss any questions you have with your health care provider. Document Released: 11/07/2004 Document Revised: 08/18/2015 Document Reviewed: 05/24/2015 Elsevier Interactive Patient Education  2018 [employer]   

## 2017-05-19 ENCOUNTER — Other Ambulatory Visit: Payer: Self-pay

## 2017-05-19 ENCOUNTER — Encounter
Admission: RE | Admit: 2017-05-19 | Discharge: 2017-05-19 | Disposition: A | Discharge status: Home / Self Care | Payer: BLUE CROSS/BLUE SHIELD | Source: Ambulatory Visit | Attending: Neurosurgery | Admitting: Neurosurgery

## 2017-05-19 DIAGNOSIS — Z01812 Encounter for preprocedural laboratory examination: Secondary | ICD-10-CM | POA: Diagnosis not present

## 2017-05-19 HISTORY — DX: Other intervertebral disc displacement, lumbar region: M51.26

## 2017-05-19 HISTORY — DX: Other intervertebral disc degeneration, lumbar region: M51.36

## 2017-05-19 HISTORY — DX: Other intervertebral disc degeneration, lumbar region without mention of lumbar back pain or lower extremity pain: M51.369

## 2017-05-19 LAB — BASIC METABOLIC PANEL
ANION GAP: 6 (ref 5–15)
BUN: 15 mg/dL (ref 6–20)
CALCIUM: 9.5 mg/dL (ref 8.9–10.3)
CO2: 26 mmol/L (ref 22–32)
CREATININE: 1.25 mg/dL — AB (ref 0.61–1.24)
Chloride: 106 mmol/L (ref 101–111)
Glucose, Bld: 87 mg/dL (ref 65–99)
Potassium: 3.6 mmol/L (ref 3.5–5.1)
SODIUM: 138 mmol/L (ref 135–145)

## 2017-05-19 LAB — URINALYSIS, COMPLETE (UACMP) WITH MICROSCOPIC
Bacteria, UA: NONE SEEN
Bilirubin Urine: NEGATIVE
GLUCOSE, UA: NEGATIVE mg/dL
Ketones, ur: 5 mg/dL — AB
Leukocytes, UA: NEGATIVE
Nitrite: NEGATIVE
Protein, ur: NEGATIVE mg/dL
SPECIFIC GRAVITY, URINE: 1.024 (ref 1.005–1.030)
Squamous Epithelial / LPF: NONE SEEN
pH: 7 (ref 5.0–8.0)

## 2017-05-19 LAB — CBC
HEMATOCRIT: 49.1 % (ref 40.0–52.0)
Hemoglobin: 16.2 g/dL (ref 13.0–18.0)
MCH: 31.7 pg (ref 26.0–34.0)
MCHC: 33 g/dL (ref 32.0–36.0)
MCV: 95.8 fL (ref 80.0–100.0)
PLATELETS: 327 10*3/uL (ref 150–440)
RBC: 5.12 MIL/uL (ref 4.40–5.90)
RDW: 14 % (ref 11.5–14.5)
WBC: 11.6 10*3/uL — AB (ref 3.8–10.6)

## 2017-05-19 LAB — PROTIME-INR
INR: 0.96
PROTHROMBIN TIME: 12.7 s (ref 11.4–15.2)

## 2017-05-19 LAB — SURGICAL PCR SCREEN
MRSA, PCR: NEGATIVE
Staphylococcus aureus: NEGATIVE

## 2017-05-19 LAB — APTT: APTT: 28 s (ref 24–36)

## 2017-05-19 NOTE — Patient Instructions (Signed)
[facility]: [facility] on the 2nd floor of [facility]. To find out your arrival time, please call [phone] between 1PM - 3PM on: May 27, 2017 TUESDAY  REMEMBER: Instructions that are not followed completely may result in serious medical risk, up to and including death; or upon the discretion of your surgeon and anesthesiologist your surgery may need to be rescheduled.  Do not eat food after midnight the night before your procedure.  No gum chewing, lozengers or hard candies.  You may however, drink CLEAR liquids up to 2 hours before you are scheduled to arrive for your surgery. Do not drink anything within 2 hours of the start of your surgery.  Clear liquids include: - water  - apple juice without pulp - clear gatorade - black coffee or tea (Do NOT add anything to the coffee or tea) Do NOT drink anything that is not on this list.    No Alcohol for 24 hours before or after surgery.  No Smoking including e-cigarettes for 24 hours prior to surgery.  No chewable tobacco products for at least 6 hours prior to surgery.  No nicotine patches on the day of surgery.  On the morning of surgery brush your teeth with toothpaste and water, you may rinse your mouth with mouthwash if you wish. Do not swallow any toothpaste or mouthwash.  Notify your doctor if there is any change in your medical condition (cold, fever, infection).  Do not wear jewelry, make-up, hairpins, clips or nail polish.  Do not wear lotions, powders, or perfumes. You may NOT wear deodorant.  Do not shave 48 hours prior to surgery. Men may shave face and neck.  Contacts and dentures may not be worn into surgery.  Do not bring valuables to the hospital, including drivers license, insurance or credit cards.  [facility] is not responsible for any belongings or valuables.   TAKE THESE MEDICATIONS THE MORNING OF SURGERY: FLEXERIL IF NEEDED  Use CHG  Soap or wipes as directed on instruction sheet.   Stop Anti-inflammatories (NSAIDS) such as MELOXICAM Advil, Aleve, Ibuprofen, Motrin, Naproxen, Naprosyn and Aspirin based products such as Excedrin, Goodys Powder, BC Powder. FOR 7 DAYS BEFORE SURGERY (May take Tylenol or Acetaminophen if needed.)  Stop ANY OVER THE COUNTER supplements until after surgery. (May continue Vitamin D, Vitamin B, and multivitamin.)  Wear comfortable clothing (specific to your surgery type) to the hospital.  Plan for stool softeners for home use.  If you are being admitted to the hospital overnight, leave your suitcase in the car. After surgery it may be brought to your room.  If you are being discharged the day of surgery, you will not be allowed to drive home. You will need a responsible adult to drive you home and stay with you that night.   If you are taking public transportation, you will need to have a responsible adult with you. Please confirm with your physician that it is acceptable to use public transportation.   Please call [phone] if you have any questions about these instructions.

## 2017-05-20 NOTE — Pre-Procedure Instructions (Signed)
FAXED UA TO DR Myer HaffYARBROUGH

## 2017-05-28 ENCOUNTER — Encounter
Admission: RE | Disposition: A | Discharge status: Home / Self Care | Payer: Self-pay | Source: Ambulatory Visit | Attending: Neurosurgery

## 2017-05-28 ENCOUNTER — Ambulatory Visit: Payer: BLUE CROSS/BLUE SHIELD

## 2017-05-28 ENCOUNTER — Ambulatory Visit: Payer: BLUE CROSS/BLUE SHIELD | Admitting: Anesthesiology

## 2017-05-28 ENCOUNTER — Ambulatory Visit
Admission: RE | Admit: 2017-05-28 | Discharge: 2017-05-28 | Disposition: A | Discharge status: Home / Self Care | Payer: BLUE CROSS/BLUE SHIELD | Source: Ambulatory Visit | Attending: Neurosurgery | Admitting: Neurosurgery

## 2017-05-28 ENCOUNTER — Encounter: Payer: Self-pay | Admitting: *Deleted

## 2017-05-28 ENCOUNTER — Other Ambulatory Visit: Payer: Self-pay

## 2017-05-28 DIAGNOSIS — M5116 Intervertebral disc disorders with radiculopathy, lumbar region: Secondary | ICD-10-CM | POA: Diagnosis not present

## 2017-05-28 DIAGNOSIS — Z419 Encounter for procedure for purposes other than remedying health state, unspecified: Secondary | ICD-10-CM

## 2017-05-28 DIAGNOSIS — F17210 Nicotine dependence, cigarettes, uncomplicated: Secondary | ICD-10-CM | POA: Diagnosis not present

## 2017-05-28 DIAGNOSIS — M5416 Radiculopathy, lumbar region: Secondary | ICD-10-CM | POA: Diagnosis not present

## 2017-05-28 DIAGNOSIS — Z981 Arthrodesis status: Secondary | ICD-10-CM | POA: Diagnosis not present

## 2017-05-28 HISTORY — PX: LUMBAR LAMINECTOMY/DECOMPRESSION MICRODISCECTOMY: SHX5026

## 2017-05-28 LAB — TYPE AND SCREEN
ABO/RH(D): A NEG
Antibody Screen: NEGATIVE

## 2017-05-28 LAB — ABO/RH: ABO/RH(D): A NEG

## 2017-05-28 SURGERY — LUMBAR LAMINECTOMY/DECOMPRESSION MICRODISCECTOMY 1 LEVEL
Anesthesia: General | Laterality: Right

## 2017-05-28 MED ORDER — DEXAMETHASONE SODIUM PHOSPHATE 10 MG/ML IJ SOLN
INTRAMUSCULAR | Status: DC | PRN
  Administered 2017-05-28: 10 mg via INTRAVENOUS

## 2017-05-28 MED ORDER — BUPIVACAINE-EPINEPHRINE (PF) 0.5% -1:200000 IJ SOLN
INTRAMUSCULAR | Status: AC
  Filled 2017-05-28: qty 30

## 2017-05-28 MED ORDER — METHYLPREDNISOLONE ACETATE 40 MG/ML IJ SUSP
INTRAMUSCULAR | Status: AC
  Filled 2017-05-28: qty 1

## 2017-05-28 MED ORDER — SUCCINYLCHOLINE CHLORIDE 20 MG/ML IJ SOLN
INTRAMUSCULAR | Status: AC
  Filled 2017-05-28: qty 1

## 2017-05-28 MED ORDER — THROMBIN 5000 UNITS EX SOLR
CUTANEOUS | Status: AC
  Filled 2017-05-28: qty 5000

## 2017-05-28 MED ORDER — LIDOCAINE HCL (CARDIAC) 20 MG/ML IV SOLN
INTRAVENOUS | Status: DC | PRN
  Administered 2017-05-28: 100 mg via INTRAVENOUS

## 2017-05-28 MED ORDER — SODIUM CHLORIDE 0.9 % IR SOLN
Status: DC | PRN
  Administered 2017-05-28: 1000 mL

## 2017-05-28 MED ORDER — CEFAZOLIN SODIUM-DEXTROSE 2-4 GM/100ML-% IV SOLN
2.0000 g | INTRAVENOUS | Status: AC
  Administered 2017-05-28: 2 g via INTRAVENOUS

## 2017-05-28 MED ORDER — SODIUM CHLORIDE 0.9 % IJ SOLN
INTRAMUSCULAR | Status: DC | PRN
  Administered 2017-05-28: 20 mL

## 2017-05-28 MED ORDER — ROCURONIUM BROMIDE 50 MG/5ML IV SOLN
INTRAVENOUS | Status: AC
  Filled 2017-05-28: qty 1

## 2017-05-28 MED ORDER — ACETAMINOPHEN 10 MG/ML IV SOLN
INTRAVENOUS | Status: AC
  Filled 2017-05-28: qty 100

## 2017-05-28 MED ORDER — MIDAZOLAM HCL 2 MG/2ML IJ SOLN
INTRAMUSCULAR | Status: AC
  Filled 2017-05-28: qty 2

## 2017-05-28 MED ORDER — PROPOFOL 10 MG/ML IV BOLUS
INTRAVENOUS | Status: AC
  Filled 2017-05-28: qty 20

## 2017-05-28 MED ORDER — BUPIVACAINE-EPINEPHRINE (PF) 0.5% -1:200000 IJ SOLN
INTRAMUSCULAR | Status: DC | PRN
  Administered 2017-05-28: 5 mL

## 2017-05-28 MED ORDER — SODIUM CHLORIDE FLUSH 0.9 % IV SOLN
INTRAVENOUS | Status: AC
  Filled 2017-05-28: qty 10

## 2017-05-28 MED ORDER — THROMBIN (RECOMBINANT) 5000 UNITS EX SOLR
CUTANEOUS | Status: DC | PRN
  Administered 2017-05-28: 5000 [IU] via TOPICAL

## 2017-05-28 MED ORDER — FENTANYL CITRATE (PF) 100 MCG/2ML IJ SOLN: 25.0000 ug | INTRAMUSCULAR | Status: DC | PRN

## 2017-05-28 MED ORDER — LIDOCAINE HCL (PF) 2 % IJ SOLN
INTRAMUSCULAR | Status: AC
  Filled 2017-05-28: qty 10

## 2017-05-28 MED ORDER — PHENYLEPHRINE HCL 10 MG/ML IJ SOLN
INTRAMUSCULAR | Status: AC
  Filled 2017-05-28: qty 1

## 2017-05-28 MED ORDER — CEFAZOLIN SODIUM-DEXTROSE 2-3 GM-%(50ML) IV SOLR
INTRAVENOUS | Status: AC
  Filled 2017-05-28: qty 50

## 2017-05-28 MED ORDER — ROCURONIUM BROMIDE 100 MG/10ML IV SOLN
INTRAVENOUS | Status: DC | PRN
  Administered 2017-05-28: 50 mg via INTRAVENOUS

## 2017-05-28 MED ORDER — PHENYLEPHRINE HCL 10 MG/ML IJ SOLN
INTRAMUSCULAR | Status: DC | PRN
  Administered 2017-05-28 (×2): 100 ug via INTRAVENOUS

## 2017-05-28 MED ORDER — ONDANSETRON HCL 4 MG/2ML IJ SOLN
INTRAMUSCULAR | Status: DC | PRN
  Administered 2017-05-28: 4 mg via INTRAVENOUS

## 2017-05-28 MED ORDER — ONDANSETRON HCL 4 MG/2ML IJ SOLN
INTRAMUSCULAR | Status: AC
  Filled 2017-05-28: qty 2

## 2017-05-28 MED ORDER — FAMOTIDINE 20 MG PO TABS
ORAL_TABLET | ORAL | Status: AC
  Administered 2017-05-28: 20 mg via ORAL
  Filled 2017-05-28: qty 1

## 2017-05-28 MED ORDER — LACTATED RINGERS IV SOLN
INTRAVENOUS | Status: DC
  Administered 2017-05-28: 07:00:00 via INTRAVENOUS

## 2017-05-28 MED ORDER — BACITRACIN 50000 UNITS IM SOLR
INTRAMUSCULAR | Status: AC
  Filled 2017-05-28: qty 1

## 2017-05-28 MED ORDER — SODIUM CHLORIDE 0.9 % IJ SOLN
INTRAMUSCULAR | Status: AC
  Filled 2017-05-28: qty 50

## 2017-05-28 MED ORDER — KETOROLAC TROMETHAMINE 30 MG/ML IJ SOLN
INTRAMUSCULAR | Status: DC | PRN
  Administered 2017-05-28: 30 mg via INTRAVENOUS

## 2017-05-28 MED ORDER — METHOCARBAMOL 500 MG PO TABS: 500.0000 mg | ORAL_TABLET | Freq: Four times a day (QID) | ORAL | 0 refills | Status: DC | PRN

## 2017-05-28 MED ORDER — ONDANSETRON HCL 4 MG/2ML IJ SOLN: 4.0000 mg | Freq: Once | INTRAMUSCULAR | Status: DC | PRN

## 2017-05-28 MED ORDER — ACETAMINOPHEN 10 MG/ML IV SOLN
INTRAVENOUS | Status: DC | PRN
  Administered 2017-05-28: 1000 mg via INTRAVENOUS

## 2017-05-28 MED ORDER — BUPIVACAINE HCL 0.5 % IJ SOLN
INTRAMUSCULAR | Status: DC | PRN
  Administered 2017-05-28: 30 mL

## 2017-05-28 MED ORDER — DEXAMETHASONE SODIUM PHOSPHATE 10 MG/ML IJ SOLN
INTRAMUSCULAR | Status: AC
  Filled 2017-05-28: qty 1

## 2017-05-28 MED ORDER — KETOROLAC TROMETHAMINE 30 MG/ML IJ SOLN
INTRAMUSCULAR | Status: AC
  Filled 2017-05-28: qty 1

## 2017-05-28 MED ORDER — BUPIVACAINE LIPOSOME 1.3 % IJ SUSP
INTRAMUSCULAR | Status: AC
  Filled 2017-05-28: qty 20

## 2017-05-28 MED ORDER — FENTANYL CITRATE (PF) 100 MCG/2ML IJ SOLN
INTRAMUSCULAR | Status: DC | PRN
  Administered 2017-05-28 (×2): 50 ug via INTRAVENOUS

## 2017-05-28 MED ORDER — OXYCODONE HCL 5 MG PO TABS: 5.0000 mg | ORAL_TABLET | ORAL | 0 refills | Status: DC | PRN

## 2017-05-28 MED ORDER — EPHEDRINE SULFATE 50 MG/ML IJ SOLN
INTRAMUSCULAR | Status: AC
  Filled 2017-05-28: qty 1

## 2017-05-28 MED ORDER — METHYLPREDNISOLONE ACETATE 40 MG/ML IJ SUSP
INTRAMUSCULAR | Status: DC | PRN
  Administered 2017-05-28: 40 mg

## 2017-05-28 MED ORDER — SODIUM CHLORIDE 0.9 % IV SOLN
INTRAVENOUS | Status: DC | PRN
  Administered 2017-05-28: 40 mL

## 2017-05-28 MED ORDER — FAMOTIDINE 20 MG PO TABS
20.0000 mg | ORAL_TABLET | Freq: Once | ORAL | Status: AC
  Administered 2017-05-28: 20 mg via ORAL

## 2017-05-28 MED ORDER — FENTANYL CITRATE (PF) 100 MCG/2ML IJ SOLN
INTRAMUSCULAR | Status: AC
  Filled 2017-05-28: qty 2

## 2017-05-28 MED ORDER — MIDAZOLAM HCL 2 MG/2ML IJ SOLN
INTRAMUSCULAR | Status: DC | PRN
  Administered 2017-05-28: 2 mg via INTRAVENOUS

## 2017-05-28 MED ORDER — PROPOFOL 10 MG/ML IV BOLUS
INTRAVENOUS | Status: DC | PRN
  Administered 2017-05-28: 170 mg via INTRAVENOUS

## 2017-05-28 MED ORDER — BUPIVACAINE HCL (PF) 0.5 % IJ SOLN
INTRAMUSCULAR | Status: AC
  Filled 2017-05-28: qty 30

## 2017-05-28 SURGICAL SUPPLY — 62 items
BLADE BOVIE TIP EXT 4 (BLADE) ×2 IMPLANT
BUR NEURO DRILL SOFT 3.0X3.8M (BURR) ×2 IMPLANT
CANISTER SUCT 1200ML W/VALVE (MISCELLANEOUS) ×4 IMPLANT
CHLORAPREP W/TINT 26ML (MISCELLANEOUS) ×4 IMPLANT
CNTNR SPEC 2.5X3XGRAD LEK (MISCELLANEOUS) ×1
CONT SPEC 4OZ STER OR WHT (MISCELLANEOUS) ×1
CONTAINER SPEC 2.5X3XGRAD LEK (MISCELLANEOUS) ×1 IMPLANT
COUNTER NEEDLE 20/40 LG (NEEDLE) ×2 IMPLANT
COVER LIGHT HANDLE STERIS (MISCELLANEOUS) ×4 IMPLANT
CUP MEDICINE 2OZ PLAST GRAD ST (MISCELLANEOUS) ×4 IMPLANT
DERMABOND ADVANCED (GAUZE/BANDAGES/DRESSINGS) ×1
DERMABOND ADVANCED .7 DNX12 (GAUZE/BANDAGES/DRESSINGS) ×1 IMPLANT
DRAPE C-ARM 42X72 X-RAY (DRAPES) ×4 IMPLANT
DRAPE LAPAROTOMY 100X77 ABD (DRAPES) ×2 IMPLANT
DRAPE MICROSCOPE SPINE 48X150 (DRAPES) ×2 IMPLANT
DRAPE POUCH INSTRU U-SHP 10X18 (DRAPES) ×2 IMPLANT
DRAPE SURG 17X11 SM STRL (DRAPES) ×8 IMPLANT
ELECT CAUTERY BLADE TIP 2.5 (TIP) ×2
ELECT EZSTD 165MM 6.5IN (MISCELLANEOUS)
ELECT REM PT RETURN 9FT ADLT (ELECTROSURGICAL) ×2
ELECTRODE CAUTERY BLDE TIP 2.5 (TIP) ×1 IMPLANT
ELECTRODE EZSTD 165MM 6.5IN (MISCELLANEOUS) IMPLANT
ELECTRODE REM PT RTRN 9FT ADLT (ELECTROSURGICAL) ×1 IMPLANT
EVICEL AIRLESS SPRAY ACCES (MISCELLANEOUS) IMPLANT
FRAME EYE SHIELD (PROTECTIVE WEAR) ×2 IMPLANT
GLOVE BIO SURGEON STRL SZ 6.5 (GLOVE) ×4 IMPLANT
GLOVE BIOGEL PI IND STRL 7.0 (GLOVE) ×2 IMPLANT
GLOVE BIOGEL PI INDICATOR 7.0 (GLOVE) ×2
GLOVE SURG SYN 6.5 ES PF (GLOVE) ×4 IMPLANT
GLOVE SURG SYN 8.5  E (GLOVE) ×3
GLOVE SURG SYN 8.5 E (GLOVE) ×3 IMPLANT
GOWN SRG XL LVL 3 NONREINFORCE (GOWNS) ×1 IMPLANT
GOWN STRL NON-REIN TWL XL LVL3 (GOWNS) ×1
GOWN STRL REUS W/ TWL LRG LVL3 (GOWN DISPOSABLE) ×1 IMPLANT
GOWN STRL REUS W/TWL LRG LVL3 (GOWN DISPOSABLE) ×1
GOWN STRL REUS W/TWL MED LVL3 (GOWN DISPOSABLE) ×2 IMPLANT
GRADUATE 1200CC STRL 31836 (MISCELLANEOUS) ×2 IMPLANT
KIT SPINAL PRONEVIEW (KITS) ×2 IMPLANT
KNIFE BAYONET SHORT DISCETOMY (MISCELLANEOUS) IMPLANT
MARKER SKIN DUAL TIP RULER LAB (MISCELLANEOUS) ×6 IMPLANT
NDL SAFETY ECLIPSE 18X1.5 (NEEDLE) ×1 IMPLANT
NEEDLE HYPO 18GX1.5 SHARP (NEEDLE) ×1
NEEDLE HYPO 22GX1.5 SAFETY (NEEDLE) ×2 IMPLANT
NS IRRIG 1000ML POUR BTL (IV SOLUTION) ×2 IMPLANT
PACK LAMINECTOMY NEURO (CUSTOM PROCEDURE TRAY) ×2 IMPLANT
PAD ARMBOARD 7.5X6 YLW CONV (MISCELLANEOUS) ×2 IMPLANT
PATTIES SURGICAL .5X1.5 (GAUZE/BANDAGES/DRESSINGS) IMPLANT
SPOGE SURGIFLO 8M (HEMOSTASIS) ×1
SPONGE SURGIFLO 8M (HEMOSTASIS) ×1 IMPLANT
SUT DVC VLOC 3-0 CL 6 P-12 (SUTURE) ×2 IMPLANT
SUT NURALON 4 0 TR CR/8 (SUTURE) IMPLANT
SUT VIC AB 0 CT1 27 (SUTURE)
SUT VIC AB 0 CT1 27XCR 8 STRN (SUTURE) IMPLANT
SUT VIC AB 2-0 CT1 18 (SUTURE) ×2 IMPLANT
SUT VICRYL 0 AB UR-6 (SUTURE) ×2 IMPLANT
SYR 10ML LL (SYRINGE) ×2 IMPLANT
SYR 20CC LL (SYRINGE) ×2 IMPLANT
SYR 30ML LL (SYRINGE) ×4 IMPLANT
SYR 3ML LL SCALE MARK (SYRINGE) ×2 IMPLANT
TOWEL OR 17X26 4PK STRL BLUE (TOWEL DISPOSABLE) ×8 IMPLANT
TUBE METRX 18MMX5CM (INSTRUMENTS) ×2 IMPLANT
TUBING CONNECTING 10 (TUBING) ×2 IMPLANT

## 2017-05-28 NOTE — Progress Notes (Signed)
Ancef 2 grams x1 ordered for surgical prophylaxis per consult.  Fulton ReekMatt Carloyn Lahue, PharmD, BCPS  05/28/17 6:32 AM

## 2017-05-28 NOTE — Transfer of Care (Signed)
Immediate Anesthesia Transfer of Care Note  Patient: Maxwell BrookingJohn H [name]  Procedure(s) Performed: LUMBAR LAMINECTOMY/DECOMPRESSION MICRODISCECTOMY 1 LEVEL-L4-5 (Right )  Patient Location: PACU  Anesthesia Type:General  Level of Consciousness: awake  Airway & Oxygen Therapy: Patient Spontanous Breathing  Post-op Assessment: Report given to RN  Post vital signs: stable  Last Vitals:  Vitals Value Taken Time  BP    Temp 36.6 C 05/28/2017  9:25 AM  Pulse 81 05/28/2017  9:25 AM  Resp    SpO2 98 % 05/28/2017  9:25 AM  Vitals shown include unvalidated device data.  Last Pain:  Vitals:   05/28/17 0615  TempSrc: Temporal         Complications: No apparent anesthesia complications

## 2017-05-28 NOTE — Anesthesia Preprocedure Evaluation (Signed)
Anesthesia Evaluation  Patient identified by MRN, date of birth, ID band Patient awake    Reviewed: Allergy & Precautions, NPO status , Patient's Chart, lab work & pertinent test results, reviewed documented beta blocker date and time   Airway Mallampati: III  TM Distance: >3 FB     Dental  (+) Chipped   Pulmonary Current Smoker,           Cardiovascular      Neuro/Psych    GI/Hepatic   Endo/Other    Renal/GU      Musculoskeletal   Abdominal   Peds  Hematology   Anesthesia Other Findings   Reproductive/Obstetrics                             Anesthesia Physical Anesthesia Plan  ASA: II  Anesthesia Plan: General   Post-op Pain Management:    Induction: Intravenous  PONV Risk Score and Plan:   Airway Management Planned: Oral ETT  Additional Equipment:   Intra-op Plan:   Post-operative Plan:   Informed Consent: I have reviewed the patients History and Physical, chart, labs and discussed the procedure including the risks, benefits and alternatives for the proposed anesthesia with the patient or authorized representative who has indicated his/her understanding and acceptance.     Plan Discussed with: CRNA  Anesthesia Plan Comments:         Anesthesia Quick Evaluation

## 2017-05-28 NOTE — Anesthesia Postprocedure Evaluation (Signed)
Anesthesia Post Note  Patient: Maxwell BrookingJohn H [name]  Procedure(s) Performed: LUMBAR LAMINECTOMY/DECOMPRESSION MICRODISCECTOMY 1 LEVEL-L4-5 (Right )  Patient location during evaluation: PACU Anesthesia Type: General Level of consciousness: awake and alert Pain management: pain level controlled Vital Signs Assessment: post-procedure vital signs reviewed and stable Respiratory status: spontaneous breathing, nonlabored ventilation, respiratory function stable and patient connected to nasal cannula oxygen Cardiovascular status: blood pressure returned to baseline and stable Postop Assessment: no apparent nausea or vomiting Anesthetic complications: no     Last Vitals:  Vitals:   05/28/17 1015 05/28/17 1046  BP: 128/83 126/77  Pulse: (!) 58   Resp: 16 16  Temp: (!) 36.3 C   SpO2: 98%     Last Pain:  Vitals:   05/28/17 1015  TempSrc:   PainSc: 0-No pain                 Alp Goldwater S

## 2017-05-28 NOTE — OR Nursing (Signed)
Discharge instructions discussed with pt and wife. Both voice understanding. 

## 2017-05-28 NOTE — OR Nursing (Signed)
Walked in hallway. Tolerated well. Voided without difficulty.

## 2017-05-28 NOTE — Discharge Instructions (Signed)

## 2017-05-28 NOTE — H&P (Signed)
History of Present Illness: 05/28/2017 [name] presents with the same symptoms as below.  He continues to have R>L leg pain.  He failed conservative management.  03/06/2017 [name] is here today with a chief complaint of right sided low back pain, in his buttock and upper posterior thigh  Duration: 1 year, worsening the past few months Quality: poking Severity: 5  Precipitating: aggravated by nothing Modifying factors: made better by heat temporarily Weakness: none Timing: none Bowel/Bladder Dysfunction: none  Conservative measures:  Physical therapy: has tried, helps at the moment, but pain returns within less than an hour  Multimodal medical therapy including regular antiinflammatories: flexeril, meloxicam, ibuprofen, prednisone  Injections: has not tried epidural steroid injections  Past Surgery: denies  Maxwell Maxwell H Hemler Jr. has no symptoms of cervical myelopathy.  The symptoms are causing a significant impact on the patient's life.   Review of Systems:  A 10 point review of systems is negative, except for the pertinent positives and negatives detailed in the HPI. Past Medical History: Past Medical History:  Diagnosis Date  . DDD (degenerative disc disease), lumbar   Past Surgical History: No past surgical history on file.  Allergies: Allergies as of 03/06/2017  . (No Known Allergies)   Medications: Outpatient Encounter Medications as of 03/06/2017  Medication Sig Dispense Refill  . cyclobenzaprine (FLEXERIL) 10 MG tablet Take 1 tablet (10 mg total) by mouth nightly as needed for Muscle spasms May only need to take at nighttime, as may cause drowsiness. 30 tablet 0  . meloxicam (MOBIC) 15 MG tablet Take 1 tablet (15 mg total) by mouth once daily 30 tablet 1   No facility-administered encounter medications on file as of 03/06/2017.   Social History: Social History   Tobacco Use  . Smoking status: Current Some Day Smoker  Packs/day: 0.25  .  Smokeless tobacco: Never Used  Substance Use Topics  . Alcohol use: Yes  Alcohol/week: 0.0 oz  . Drug use: Never   Family Medical History: Family History  Problem Relation Age of Onset  . Lung cancer Father  . No Known Problems Sister  . No Known Problems Brother  . Ulcerative colitis Son   Physical Examination:  Vitals:   05/28/17 0615  BP: 126/84  Pulse: 60  Resp: 18  Temp: (!) 97 F (36.1 C)  SpO2: 100%      General: Patient is well developed, well nourished, calm, collected, and in no apparent distress. Attention to examination is appropriate.  Psychiatric: Patient is non-anxious.  Head: Pupils equal, round, and reactive to light.  ENT: Oral mucosa appears well hydrated.  Neck: Supple. Full range of motion.  Respiratory: Patient is breathing without any difficulty.  Extremities: No edema.  Vascular: Palpable dorsal pedal pulses.  Skin: On exposed skin, there are no abnormal skin lesions.  Heart sounds normal no MRG. Chest Clear to Auscultation Bilaterally.   NEUROLOGICAL:  General: In no acute distress.  Awake, alert, oriented to person, place, and time. Speech is clear and fluent. Fund of knowledge is appropriate.   Cranial Nerves: Pupils equal round and reactive to light. Facial tone is symmetric. Facial sensation is symmetric. Shoulder shrug is symmetric. Tongue protrusion is midline. There is no pronator drift.  ROM of spine: full. Palpation of spine: non tender.   Strength: Side Biceps Triceps Deltoid Interossei Grip Wrist Ext. Wrist Flex.  R 5 5 5 5 5 5 5   L 5 5 5 5 5 5 5    Side Iliopsoas  Quads Hamstring PF DF EHL  R 5 5 5 5 5 5   L 5 5 5 5 5 5    Reflexes are 1+ and symmetric at the biceps, triceps, brachioradialis, patella and achilles. Bilateral upper and lower extremity sensation is intact to light touch. Clonus is not present. Toes are down-going. Gait is normal. No difficulty with tandem gait. Hoffman's is absent.   Medical Decision  Making  Imaging: MRI L spine 02/28/2017 IMPRESSION: 1. Bulky right paracentral disc extrusion at L4-L5 with severe stenosis at the level of the descending right L5 nerve. Mild to moderate spinal stenosis. Mild bilateral L4 foraminal stenosis. 2. Advanced chronic disc and endplate degeneration at L5-S1 with mild foraminal stenosis.  Electronically Signed ByDalbert Batman M.D. On: 02/28/2017 15:35  I have personally reviewed the images and agree with the above interpretation.  Assessment and Plan: [name] is a pleasant 52 y.o. male with right-sided L5 radiculopathy due to large disc herniation at L4-5.   He has failed conservative management.  Plan for R L4-5 microdiscectomy.  Venetia Night MD, [facility] Department of Neurosurgery

## 2017-05-28 NOTE — Anesthesia Procedure Notes (Signed)
Procedure Name: Intubation Date/Time: 05/28/2017 7:37 AM Performed by: Carron Curie, CRNA Pre-anesthesia Checklist: Patient identified, Patient being monitored, Timeout performed, Emergency Drugs available and Suction available Patient Re-evaluated:Patient Re-evaluated prior to induction Oxygen Delivery Method: Circle system utilized Preoxygenation: Pre-oxygenation with 100% oxygen Induction Type: IV induction Ventilation: Mask ventilation without difficulty Laryngoscope Size: Mac and 3 Grade View: Grade I Tube type: Oral Tube size: 7.5 mm Number of attempts: 1 Airway Equipment and Method: Stylet Placement Confirmation: ETT inserted through vocal cords under direct vision,  positive ETCO2 and breath sounds checked- equal and bilateral Secured at: 22 cm Tube secured with: Tape Dental Injury: Teeth and Oropharynx as per pre-operative assessment

## 2017-05-28 NOTE — Op Note (Signed)
Indications: Mr. Lindell is a 52 yo male who presented with lumbar radiculopathy due to disc herniation at L4-5.  He failed conservative management and elected for surgical intervention.  Findings: large disc herniation at L4-5  Preoperative Diagnosis: Lumbar radiculopathy Postoperative Diagnosis: same   EBL: 20 ml IVF: 800 ml Drains: none Disposition: Extubated and Stable to PACU Complications: none  No foley catheter was placed.   Preoperative Note:   Risks of surgery discussed include: infection, bleeding, stroke, coma, death, paralysis, CSF leak, nerve/spinal cord injury, numbness, tingling, weakness, complex regional pain syndrome, recurrent stenosis and/or disc herniation, vascular injury, development of instability, neck/back pain, need for further surgery, persistent symptoms, development of deformity, and the risks of anesthesia. The patient understood these risks and agreed to proceed.  Operative Note:    The patient was then brought from the preoperative center with intravenous access established.  The patient underwent general anesthesia and endotracheal tube intubation, and was then rotated on the [place] rail top where all pressure points were appropriately padded.  The skin was then thoroughly cleansed.  Perioperative antibiotic prophylaxis was administered.  Sterile prep and drapes were then applied and a timeout was then observed.  C-arm was brought into the field under sterile conditions, and the L4-5 disc space identified and marked with an incision on the right 1cm lateral to midline.  Once this was complete a 2 cm incision was opened with the use of a #10 blade knife.  The metrics tubes were sequentially advanced under lateral fluoroscopy until a 18 x 50 mm Metrix tube was placed over the facet and lamina and secured to the bed.    The microscope was then sterilely brought into the field and muscle creep was hemostased with a bipolar and resected with a pituitary  rongeur.  A Bovie extender was then used to expose the spinous process and lamina.  Careful attention was placed to not violate the facet capsule. A 3 mm matchstick drill bit was then used to make a hemi-laminotomy trough until the ligamentum flavum was exposed.  This was extended to the base of the spinous process.  Once this was complete and the underlying ligamentum flavum was visualized this was dissected with an up angle curette and resected with a #2 and #3 mm biting Kerrison.  The laminotomy opening was also expanded in similar fashion and hemostasis was obtained with Surgifoam and a patty as well as bone wax.  The rostral aspect of the caudal level of the lamina was also resected with a #2 biting Kerrison effort to further enhance exposure.  Once the underlying dura was visualized a Penfield 4 was then used to dissect and expose the traversing nerve root.  Once this was identified a nerve root retractor suction was used to mobilize this medially.  The venous plexus was hemostased with Surgifoam and light bipolar use.  An annulotomy defect was noted, and disc space contents were noted to to come through the annulotomy.    The disc herniation was identified and dissected free using a balltip probe. The pituitary rongeur was used to remove the extruded disc fragments. Once the thecal sac and nerve root were noted to be relaxed and under less tension the ball-tipped feeler was passed along the foramen distally to to ensure no residual compression was noted.    A Depo-Medrol soaked Gelfoam pledget was placed along the nerve root for 2 minutes and removed.  The area was irrigated. The tube system was then removed under microscopic  visualization and hemostasis was obtained with a bipolar.    The fascial layer was reapproximated with the use of a 0- Vicryl suture.  Subcutaneous tissue layer was reapproximated using 2-0 Vicryl suture.  3-0 monocryl was used on the skin. The skin was then cleansed and Dermabond  was used to close the skin opening.  Patient was then rotated back to the preoperative bed awakened from anesthesia and taken to recovery all counts are correct in this case.   I performed the entire procedure with the assistance of Amanda Ferri PA as an Designer, televisioIvar Drapen/film setassistant surgeon.  Venetia Nighthester Shynice Sigel MD

## 2017-05-28 NOTE — Anesthesia Post-op Follow-up Note (Signed)
Anesthesia QCDR form completed.        

## 2017-05-28 NOTE — Discharge Summary (Signed)
Procedure: L4/L5 decompression Procedure date: 05/28/2017 Diagnosis: Right lumbar radiculopathy  History: Maxwell [name] is here for L4/5 decompression for lumbar radiculopathy. Tolerated procedure without issue. Seen in recovery, sleepy from anesthesia. Answers questions and converses appropriately. Denies pain in back and legs. States continued numbness, but denies new, additional numbness in lower extremities.   Physical Exam: Vitals:   05/28/17 0615  BP: 126/84  Pulse: 60  Resp: 18  Temp: (!) 97 F (36.1 C)  SpO2: 100%    AA Ox3 CNI Strength:5/5 throughout Sensation: intact and symmetric  Assessment/Plan:  Maxwell [name] is s/p L4/L5 decompression for right lumbar radiculopathy.  Pain will continue to be controlled with tylenol, NSAID, robaxin, and oxycodone as needed. Discharge instructions provided. 2 week follow up appointment in clinic already scheduled.   Ivar DrapeAmanda Desarea Ohagan PA-C Department of Neurosurgery

## 2017-06-08 ENCOUNTER — Other Ambulatory Visit: Payer: Self-pay | Admitting: Family Medicine

## 2017-06-08 DIAGNOSIS — M940 Chondrocostal junction syndrome [Tietze]: Secondary | ICD-10-CM

## 2017-07-24 ENCOUNTER — Other Ambulatory Visit: Payer: Self-pay

## 2017-07-24 ENCOUNTER — Other Ambulatory Visit: Payer: Self-pay | Admitting: Family Medicine

## 2017-07-24 DIAGNOSIS — N529 Male erectile dysfunction, unspecified: Secondary | ICD-10-CM

## 2017-09-09 DIAGNOSIS — M545 Low back pain: Secondary | ICD-10-CM | POA: Diagnosis not present

## 2017-10-01 DIAGNOSIS — G8929 Other chronic pain: Secondary | ICD-10-CM | POA: Diagnosis not present

## 2017-10-01 DIAGNOSIS — R531 Weakness: Secondary | ICD-10-CM | POA: Diagnosis not present

## 2017-10-01 DIAGNOSIS — M545 Low back pain: Secondary | ICD-10-CM | POA: Diagnosis not present

## 2017-10-08 DIAGNOSIS — R531 Weakness: Secondary | ICD-10-CM | POA: Diagnosis not present

## 2017-10-08 DIAGNOSIS — M545 Low back pain: Secondary | ICD-10-CM | POA: Diagnosis not present

## 2017-10-08 DIAGNOSIS — G8929 Other chronic pain: Secondary | ICD-10-CM | POA: Diagnosis not present

## 2017-10-16 DIAGNOSIS — R531 Weakness: Secondary | ICD-10-CM | POA: Diagnosis not present

## 2017-10-16 DIAGNOSIS — G8929 Other chronic pain: Secondary | ICD-10-CM | POA: Diagnosis not present

## 2017-10-16 DIAGNOSIS — M545 Low back pain: Secondary | ICD-10-CM | POA: Diagnosis not present

## 2017-10-22 DIAGNOSIS — M545 Low back pain: Secondary | ICD-10-CM | POA: Diagnosis not present

## 2017-10-22 DIAGNOSIS — M5136 Other intervertebral disc degeneration, lumbar region: Secondary | ICD-10-CM | POA: Diagnosis not present

## 2017-10-22 DIAGNOSIS — G8929 Other chronic pain: Secondary | ICD-10-CM | POA: Diagnosis not present

## 2017-10-22 DIAGNOSIS — R531 Weakness: Secondary | ICD-10-CM | POA: Diagnosis not present

## 2017-10-27 ENCOUNTER — Other Ambulatory Visit: Payer: Self-pay | Admitting: Family Medicine

## 2017-10-27 DIAGNOSIS — N529 Male erectile dysfunction, unspecified: Secondary | ICD-10-CM

## 2017-10-30 DIAGNOSIS — M5136 Other intervertebral disc degeneration, lumbar region: Secondary | ICD-10-CM | POA: Diagnosis not present

## 2017-10-30 DIAGNOSIS — M545 Low back pain: Secondary | ICD-10-CM | POA: Diagnosis not present

## 2017-10-30 DIAGNOSIS — R531 Weakness: Secondary | ICD-10-CM | POA: Diagnosis not present

## 2017-10-30 DIAGNOSIS — G8929 Other chronic pain: Secondary | ICD-10-CM | POA: Diagnosis not present

## 2017-10-31 ENCOUNTER — Ambulatory Visit: Payer: BLUE CROSS/BLUE SHIELD | Admitting: Family Medicine

## 2017-10-31 ENCOUNTER — Encounter: Payer: Self-pay | Admitting: Family Medicine

## 2017-10-31 VITALS — BP 120/80 | HR 60 | Ht 70.0 in | Wt 189.0 lb

## 2017-10-31 DIAGNOSIS — N529 Male erectile dysfunction, unspecified: Secondary | ICD-10-CM

## 2017-10-31 MED ORDER — TADALAFIL 20 MG PO TABS: ORAL_TABLET | ORAL | 1 refills | Status: DC

## 2017-10-31 MED ORDER — TADALAFIL 20 MG PO TABS: ORAL_TABLET | ORAL | 3 refills | Status: DC

## 2017-10-31 NOTE — Progress Notes (Signed)
Date:  10/31/2017   Name:  Maxwell [name]   DOB:  11-23-1965   MRN:  [id number]   Chief Complaint: Erectile Dysfunction Erectile Dysfunction  This is a chronic problem. The current episode started more than 1 year ago. The problem is unchanged. The nature of his difficulty is achieving erection and maintaining erection. He reports no anxiety, decreased libido or performance anxiety. Irritative symptoms do not include frequency, nocturia or urgency. Obstructive symptoms do not include dribbling, incomplete emptying, an intermittent stream, a slower stream, straining or a weak stream. Pertinent negatives include no chills, dysuria, genital pain, hematuria, hesitancy or inability to urinate. Nothing aggravates the symptoms. Past treatments include sildenafil. The treatment provided moderate relief.     Review of Systems  Constitutional: Negative for chills and fever.  HENT: Negative for drooling, ear discharge, ear pain and sore throat.   Respiratory: Negative for cough, shortness of breath and wheezing.   Cardiovascular: Negative for chest pain, palpitations and leg swelling.  Gastrointestinal: Negative for abdominal pain, blood in stool, constipation, diarrhea and nausea.  Endocrine: Negative for polydipsia.  Genitourinary: Negative for decreased libido, dysuria, frequency, hematuria, hesitancy, incomplete emptying, nocturia and urgency.  Musculoskeletal: Negative for back pain, myalgias and neck pain.  Skin: Negative for rash.  Allergic/Immunologic: Negative for environmental allergies.  Neurological: Negative for dizziness and headaches.  Hematological: Does not bruise/bleed easily.  Psychiatric/Behavioral: Negative for suicidal ideas. The patient is not nervous/anxious.     There are no active problems to display for this patient.   No Known Allergies  Past Surgical History:  Procedure Laterality Date  . LUMBAR LAMINECTOMY/DECOMPRESSION MICRODISCECTOMY Right 05/28/2017   Procedure: LUMBAR LAMINECTOMY/DECOMPRESSION MICRODISCECTOMY 1 LEVEL-L4-5;  Surgeon: Venetia NightYarbrough, Chester, MD;  Location: [facility] ORS;  Service: Neurosurgery;  Laterality: Right;    Social History   Tobacco Use  . Smoking status: Current Some Day Smoker    Types: Cigars  . Smokeless tobacco: Former NeurosurgeonUser    Types: Snuff  . Tobacco comment: counseled concerning meds and patches  Substance Use Topics  . Alcohol use: Yes    Alcohol/week: 2.0 standard drinks    Types: 2 Cans of beer per week  . Drug use: No     Medication list has been reviewed and updated.  Current Meds  Medication Sig  . Multiple Vitamins-Minerals (MULTIVITAMIN PO) Take 1 tablet by mouth daily.  . tadalafil (ADCIRCA/CIALIS) 20 MG tablet TAKE 1 TABLET DAILY AS NEEDED FOR ERECTILE DYSFUNCTION  . [DISCONTINUED] tadalafil (ADCIRCA/CIALIS) 20 MG tablet TAKE 1 TABLET DAILY AS NEEDED FOR ERECTILE DYSFUNCTION  . [DISCONTINUED] tadalafil (ADCIRCA/CIALIS) 20 MG tablet TAKE 1 TABLET DAILY AS NEEDED FOR ERECTILE DYSFUNCTION    PHQ 2/9 Scores 05/13/2017 07/07/2015  PHQ - 2 Score 0 0  PHQ- 9 Score 2 -    Physical Exam  Constitutional: He is oriented to person, place, and time.  HENT:  Head: Normocephalic.  Right Ear: External ear normal.  Left Ear: External ear normal.  Nose: Nose normal.  Mouth/Throat: Oropharynx is clear and moist.  Eyes: Pupils are equal, round, and reactive to light. Conjunctivae and EOM are normal. Right eye exhibits no discharge. Left eye exhibits no discharge. No scleral icterus.  Neck: Normal range of motion. Neck supple. No JVD present. No tracheal deviation present. No thyromegaly present.  Cardiovascular: Normal rate, regular rhythm, normal heart sounds and intact distal pulses. Exam reveals no gallop and no friction rub.  No murmur heard. Pulmonary/Chest: Breath sounds normal. No respiratory  distress. He has no wheezes. He has no rales.  Abdominal: Soft. Bowel sounds are normal. He exhibits no mass.  There is no hepatosplenomegaly. There is no tenderness. There is no rebound, no guarding and no CVA tenderness.  Musculoskeletal: Normal range of motion. He exhibits no edema or tenderness.  Lymphadenopathy:    He has no cervical adenopathy.  Neurological: He is alert and oriented to person, place, and time. He has normal strength and normal reflexes. No cranial nerve deficit.  Skin: Skin is warm. No rash noted.  Nursing note and vitals reviewed.   BP 120/80   Pulse 60   Ht 5\' 10"  (1.778 m)   Wt 189 lb (85.7 kg)   BMI 27.12 kg/m   Assessment and Plan:  1. Vasculogenic erectile dysfunction, unspecified vasculogenic erectile dysfunction type Controlled with tedalafil 20 one  prn  - tadalafil (ADCIRCA/CIALIS) 20 MG tablet; TAKE 1 TABLET DAILY AS NEEDED FOR ERECTILE DYSFUNCTION  Dispense: 18 tablet; Refill: 1    Dr. [facility]  10/31/2017

## 2017-11-06 DIAGNOSIS — M47816 Spondylosis without myelopathy or radiculopathy, lumbar region: Secondary | ICD-10-CM | POA: Diagnosis not present

## 2017-11-06 DIAGNOSIS — G8929 Other chronic pain: Secondary | ICD-10-CM | POA: Diagnosis not present

## 2017-11-06 DIAGNOSIS — M545 Low back pain: Secondary | ICD-10-CM | POA: Diagnosis not present

## 2017-11-13 DIAGNOSIS — M5136 Other intervertebral disc degeneration, lumbar region: Secondary | ICD-10-CM | POA: Diagnosis not present

## 2017-11-13 DIAGNOSIS — G8929 Other chronic pain: Secondary | ICD-10-CM | POA: Diagnosis not present

## 2017-11-13 DIAGNOSIS — M545 Low back pain: Secondary | ICD-10-CM | POA: Diagnosis not present

## 2017-11-13 DIAGNOSIS — R531 Weakness: Secondary | ICD-10-CM | POA: Diagnosis not present

## 2017-11-17 DIAGNOSIS — M545 Low back pain: Secondary | ICD-10-CM | POA: Diagnosis not present

## 2017-11-17 DIAGNOSIS — M9903 Segmental and somatic dysfunction of lumbar region: Secondary | ICD-10-CM | POA: Diagnosis not present

## 2018-02-20 ENCOUNTER — Ambulatory Visit: Payer: Self-pay | Admitting: Family Medicine

## 2018-02-27 ENCOUNTER — Encounter: Payer: Self-pay | Admitting: Family Medicine

## 2018-02-27 ENCOUNTER — Ambulatory Visit: Payer: BLUE CROSS/BLUE SHIELD | Admitting: Family Medicine

## 2018-02-27 VITALS — BP 120/80 | HR 80 | Ht 70.0 in | Wt 192.0 lb

## 2018-02-27 DIAGNOSIS — F5101 Primary insomnia: Secondary | ICD-10-CM

## 2018-02-27 MED ORDER — TRAZODONE HCL 50 MG PO TABS: 25.0000 mg | ORAL_TABLET | Freq: Every evening | ORAL | 3 refills | Status: DC | PRN

## 2018-02-27 NOTE — Progress Notes (Signed)
Date:  02/27/2018   Name:  Maxwell [name]   DOB:  [birth year]   MRN:  [id number]   Chief Complaint: Insomnia (sleeps 2 hrs then awake 2 hrs, etc- has tried 30mg  of melatonin, tylenol wine. Every 2 weeks will have a good 8 hrs sleep)  Insomnia  Primary symptoms: fragmented sleep, sleep disturbance, difficulty falling asleep, no somnolence, no frequent awakening, no premature morning awakening, no malaise/fatigue, no napping.   The current episode started one month. The onset quality is gradual. The problem is unchanged. The symptoms are aggravated by alcohol. Past treatments include alcohol and other (melatonin). The treatment provided moderate relief. Typical bedtime:  10-11 P.M..  How long after going to bed to you fall asleep: over an hour.   PMH includes: no depression.    Review of Systems  Constitutional: Negative for chills, fever and malaise/fatigue.  HENT: Negative for drooling, ear discharge, ear pain and sore throat.   Respiratory: Negative for cough, shortness of breath and wheezing.   Cardiovascular: Negative for chest pain, palpitations and leg swelling.  Gastrointestinal: Negative for abdominal pain, blood in stool, constipation, diarrhea and nausea.  Endocrine: Negative for polydipsia.  Genitourinary: Negative for dysuria, frequency, hematuria and urgency.  Musculoskeletal: Negative for back pain, myalgias and neck pain.  Skin: Negative for rash.  Allergic/Immunologic: Negative for environmental allergies.  Neurological: Negative for dizziness and headaches.  Hematological: Does not bruise/bleed easily.  Psychiatric/Behavioral: Positive for sleep disturbance. Negative for behavioral problems, confusion, decreased concentration, depression, dysphoric mood and suicidal ideas. The patient has insomnia. The patient is not nervous/anxious and is not hyperactive.     There are no active problems to display for this patient.   No Known Allergies  Past Surgical History:    Procedure Laterality Date  . LUMBAR LAMINECTOMY/DECOMPRESSION MICRODISCECTOMY Right 05/28/2017   Procedure: LUMBAR LAMINECTOMY/DECOMPRESSION MICRODISCECTOMY 1 LEVEL-L4-5;  Surgeon: Venetia Night, MD;  Location: [facility] ORS;  Service: Neurosurgery;  Laterality: Right;    Social History   Tobacco Use  . Smoking status: Current Some Day Smoker    Types: Cigars  . Smokeless tobacco: Former Neurosurgeon    Types: Snuff  . Tobacco comment: counseled concerning meds and patches  Substance Use Topics  . Alcohol use: Yes    Alcohol/week: 2.0 standard drinks    Types: 2 Cans of beer per week  . Drug use: No     Medication list has been reviewed and updated.  Current Meds  Medication Sig  . Multiple Vitamins-Minerals (MULTIVITAMIN PO) Take 1 tablet by mouth daily.  . naproxen sodium (ALEVE) 220 MG tablet Take 440 mg by mouth daily as needed (for pain or headache).  . tadalafil (ADCIRCA/CIALIS) 20 MG tablet TAKE 1 TABLET DAILY AS NEEDED FOR ERECTILE DYSFUNCTION    PHQ 2/9 Scores 05/13/2017 07/07/2015  PHQ - 2 Score 0 0  PHQ- 9 Score 2 -    Physical Exam Vitals signs and nursing note reviewed.  HENT:     Head: Normocephalic.     Right Ear: External ear normal.     Left Ear: External ear normal.     Nose: Nose normal.  Eyes:     General: No scleral icterus.       Right eye: No discharge.        Left eye: No discharge.     Conjunctiva/sclera: Conjunctivae normal.     Pupils: Pupils are equal, round, and reactive to light.  Neck:     Musculoskeletal: Normal range  of motion and neck supple.     Thyroid: No thyroid mass, thyromegaly or thyroid tenderness.     Vascular: No JVD.     Trachea: No tracheal deviation.  Cardiovascular:     Rate and Rhythm: Normal rate and regular rhythm.     Heart sounds: Normal heart sounds. No murmur. No friction rub. No gallop.   Pulmonary:     Effort: No respiratory distress.     Breath sounds: Normal breath sounds. No wheezing or rales.  Abdominal:      General: Bowel sounds are normal.     Palpations: Abdomen is soft. There is no mass.     Tenderness: There is no abdominal tenderness. There is no guarding or rebound.  Musculoskeletal: Normal range of motion.        General: No tenderness.  Lymphadenopathy:     Cervical: No cervical adenopathy.  Skin:    General: Skin is warm.     Findings: No rash.  Neurological:     Mental Status: He is alert and oriented to person, place, and time.     Cranial Nerves: No cranial nerve deficit.     Deep Tendon Reflexes: Reflexes are normal and symmetric.     BP 120/80   Pulse 80   Ht 5\' 10"  (1.778 m)   Wt 192 lb (87.1 kg)   BMI 27.55 kg/m   Assessment and Plan: 1. Primary insomnia Chronic. Started about 3 months ago- will draw TSH for fatigue and give trazodone as needed for sleep. Pt has tried otc Melatonin and tylenol pm without success. Next step CBC if trazodone doesn't help - TSH - traZODone (DESYREL) 50 MG tablet; Take 0.5-1 tablets (25-50 mg total) by mouth at bedtime as needed for sleep.  Dispense: 30 tablet; Refill: 3

## 2018-02-27 NOTE — Patient Instructions (Signed)

## 2018-07-13 LAB — HM HEPATITIS C SCREENING LAB: HM Hepatitis Screen: NEGATIVE

## 2018-07-13 LAB — HM HIV SCREENING LAB: HM HIV Screening: NEGATIVE

## 2018-07-30 DIAGNOSIS — M545 Low back pain: Secondary | ICD-10-CM | POA: Diagnosis not present

## 2018-07-30 DIAGNOSIS — G8929 Other chronic pain: Secondary | ICD-10-CM | POA: Diagnosis not present

## 2018-09-17 ENCOUNTER — Telehealth: Payer: Self-pay

## 2018-09-17 NOTE — Telephone Encounter (Signed)
The medicine dr Ronnald Ramp had given when he was last seen gave him headaches. I ask him did he want to continue to come in for insomnia he said no and that he will deal with it.

## 2018-09-17 NOTE — Telephone Encounter (Signed)
Patient needs appt for follow up of insomnia.  Thankyou.

## 2018-09-23 ENCOUNTER — Other Ambulatory Visit: Payer: Self-pay

## 2018-09-23 ENCOUNTER — Ambulatory Visit: Payer: BC Managed Care – PPO | Admitting: Family Medicine

## 2018-09-25 ENCOUNTER — Ambulatory Visit: Payer: BC Managed Care – PPO | Admitting: Family Medicine

## 2018-10-08 ENCOUNTER — Encounter: Payer: Self-pay | Admitting: Student in an Organized Health Care Education/Training Program

## 2018-10-08 ENCOUNTER — Other Ambulatory Visit: Payer: Self-pay

## 2018-10-08 ENCOUNTER — Ambulatory Visit
Payer: BC Managed Care – PPO | Attending: Student in an Organized Health Care Education/Training Program | Admitting: Student in an Organized Health Care Education/Training Program

## 2018-10-08 VITALS — BP 138/88 | HR 87 | Temp 98.6°F | Resp 16 | Ht 70.0 in | Wt 178.0 lb

## 2018-10-08 DIAGNOSIS — G894 Chronic pain syndrome: Secondary | ICD-10-CM | POA: Insufficient documentation

## 2018-10-08 DIAGNOSIS — M961 Postlaminectomy syndrome, not elsewhere classified: Secondary | ICD-10-CM | POA: Insufficient documentation

## 2018-10-08 DIAGNOSIS — Z9889 Other specified postprocedural states: Secondary | ICD-10-CM | POA: Insufficient documentation

## 2018-10-08 DIAGNOSIS — M7918 Myalgia, other site: Secondary | ICD-10-CM

## 2018-10-08 MED ORDER — METHOCARBAMOL 750 MG PO TABS: 750.0000 mg | ORAL_TABLET | Freq: Three times a day (TID) | ORAL | 0 refills | Status: DC | PRN

## 2018-10-08 MED ORDER — GABAPENTIN 300 MG PO CAPS: ORAL_CAPSULE | ORAL | 0 refills | Status: DC

## 2018-10-08 NOTE — Progress Notes (Signed)
Patient's Name: Maxwell [name]  MRN: [id number]  Referring Provider: Meade Maw, MD  DOB: 09-Jun-1965  PCP: Juline Patch, MD  DOS: 10/08/2018  Note by: Gillis Santa, MD  Service setting: Ambulatory outpatient  Specialty: Interventional Pain Management  Location: [facility] (AMB) Pain Management Facility  Visit type: Initial Patient Evaluation  Patient type: New Patient   Primary Reason(s) for Visit: Encounter for initial evaluation of one or more chronic problems (new to examiner) potentially causing chronic pain, and posing a threat to normal musculoskeletal function. (Level of risk: High) CC: Back Pain (lumbar right side )  HPI  Maxwell [name] is a 53 y.o. year old, male patient, who comes today to see [place] for the first time for an initial evaluation of his chronic pain. He has Chronic pain syndrome; Failed back surgical syndrome; and Lumbar post-laminectomy syndrome on their problem list. Today he comes in for evaluation of his Back Pain (lumbar right side )  Pain Assessment: Location: Lower, Right Back Radiating: denies Onset: More than a month ago Duration: Chronic pain Quality: Discomfort, Constant, Other (Comment), Aching, Throbbing(shocky, feels like something is pinching a nerve.) Severity: 7 /10 (subjective, self-reported pain score)  Note: Reported level is inconsistent with clinical observations.                         When using our objective Pain Scale, levels between 6 and 10/10 are said to belong in an emergency room, as it progressively worsens from a 6/10, described as severely limiting, requiring emergency care not usually available at an outpatient pain management facility. At a 6/10 level, communication becomes difficult and requires great effort. Assistance to reach the emergency department may be required. Facial flushing and profuse sweating along with potentially dangerous increases in heart rate and blood pressure will be evident. Effect on ADL: the pain gets better and  better as the day goes on. Timing: Constant Modifying factors: lying prone BP: 138/88  HR: 87  Onset and Duration: Gradual has gotten worse after his lumbar spine surgery patient had lumbar laminectomy and discectomy performed at L4-L5 for lumbar radiculopathy Cause of pain: Lumbar radiculopathy and subsequent Surgery Severity: Getting worse Timing: Not influenced by the time of the day and After activity or exercise Aggravating Factors: Kneeling, Lifiting, Motion, Prolonged sitting, Surgery made it worse and Twisting, lumbar extension Alleviating Factors: Stretching, Hot packs, Lying down, Medications, Resting, Sitting, Warm showers or baths and Chiropractic manipulations Associated Problems: Night-time cramps, Numbness, Sadness, Tingling and Pain that does not allow patient to sleep Quality of Pain: Aching, Agonizing, Cramping, Distressing, Dreadful, Punishing, Shooting, Splitting, Stabbing, Throbbing and Tingling Previous Examinations or Tests: MRI scan, X-rays, Neurosurgical evaluation, Orthopedic evaluation and Chiropractic evaluation Previous Treatments: Physical Therapy, Relaxation therapy, Steroid treatments by mouth and Stretching exercises  The patient comes into the clinics today for the first time for a chronic pain management evaluation.   Patient is a pleasant 53 year old male who is status post L4-L5 microdiscectomy, L4-L5 decompression for lumbar radiculopathy.  This was done on 05/28/2017.  Patient presents with axial low back pain that presents in a bandlike pattern.  Patient denies radiation to his lower extremities.  Patient denies any bowel or bladder dysfunction. He is being referred for consideration of spinal cord stimulator trial by Dr. Cari Caraway.  After having a discussion with Maxwell [name], he states that he is not interested in spinal cord stimulation nor is he interested in any interventional pain procedures as he  states "I do not do needles".  We will be primarily  focusing on medication management as the patient is not interested in any interventional pain therapies that I have to offer.  Patient is on Robaxin at 500 mg twice daily and does find some benefit with that but it does not last long enough.  Patient is not on any opioid therapy.  He denies having tried gabapentin.  Historic Controlled Substance Pharmacotherapy Review   Historical Monitoring: The patient  reports no history of drug use. List of all UDS Test(s): No results found for: MDMA, COCAINSCRNUR, [place], [place], CANNABQUANT, THCU, [place] List of other Serum/Urine Drug Screening Test(s):  No results found for: AMPHSCRSER, BARBSCRSER, BENZOSCRSER, COCAINSCRSER, COCAINSCRNUR, PCPSCRSER, PCPQUANT, THCSCRSER, THCU, CANNABQUANT, OPIATESCRSER, OXYSCRSER, PROPOXSCRSER, ETH Historical Background Evaluation: [place] PMP: PDMP reviewed during this encounter. Six (6) year initial data search conducted.               Department of public safety, offender search: Editor, commissioning Information) Non-contributory Risk Assessment Profile: Aberrant behavior: None observed or detected today Risk factors for fatal opioid overdose: None identified today Fatal overdose hazard ratio (HR): Calculation deferred Non-fatal overdose hazard ratio (HR): Calculation deferred Risk of opioid abuse or dependence: 0.7-3.0% with doses ? 36 MME/day and 6.1-26% with doses ? 120 MME/day. Substance use disorder (SUD) risk level: See below Personal History of Substance Abuse (SUD-Substance use disorder):  Alcohol: Negative  Illegal Drugs: Negative  Rx Drugs: Negative  ORT Risk Level calculation: Low Risk Opioid Risk Tool - 10/08/18 0914      Family History of Substance Abuse   Alcohol  Negative    Illegal Drugs  Negative    Rx Drugs  Negative      Personal History of Substance Abuse   Alcohol  Negative    Illegal Drugs  Negative    Rx Drugs  Negative      Age   Age between 70-45 years   No      Psychological Disease    Psychological Disease  Negative    Depression  Negative      Total Score   Opioid Risk Tool Scoring  0    Opioid Risk Interpretation  Low Risk      ORT Scoring interpretation table:  Score <3 = Low Risk for SUD  Score between 4-7 = Moderate Risk for SUD  Score >8 = High Risk for Opioid Abuse   PHQ-2 Depression Scale:  Total score:    PHQ-2 Scoring interpretation table: (Score and probability of major depressive disorder)  Score 0 = No depression  Score 1 = 15.4% Probability  Score 2 = 21.1% Probability  Score 3 = 38.4% Probability  Score 4 = 45.5% Probability  Score 5 = 56.4% Probability  Score 6 = 78.6% Probability   PHQ-9 Depression Scale:  Total score:    PHQ-9 Scoring interpretation table:  Score 0-4 = No depression  Score 5-9 = Mild depression  Score 10-14 = Moderate depression  Score 15-19 = Moderately severe depression  Score 20-27 = Severe depression (2.4 times higher risk of SUD and 2.89 times higher risk of overuse)   Pharmacologic Plan: As per protocol, I have not taken over any controlled substance management, pending the results of ordered tests and/or consults.            Initial impression: Pending review of available data and ordered tests.  Meds   Current Outpatient Medications:  Marland Kitchen  Multiple Vitamins-Minerals (MULTIVITAMIN PO), Take 1 tablet  by mouth daily., Disp: , Rfl:  .  naproxen sodium (ALEVE) 220 MG tablet, Take 440 mg by mouth daily as needed (for pain or headache)., Disp: , Rfl:  .  tadalafil (ADCIRCA/CIALIS) 20 MG tablet, TAKE 1 TABLET DAILY AS NEEDED FOR ERECTILE DYSFUNCTION, Disp: 54 tablet, Rfl: 3 .  gabapentin (NEURONTIN) 300 MG capsule, Take 1 capsule (300 mg total) by mouth at bedtime for 30 days, THEN 2 capsules (600 mg total) at bedtime., Disp: 90 capsule, Rfl: 0 .  methocarbamol (ROBAXIN) 750 MG tablet, Take 1 tablet (750 mg total) by mouth every 8 (eight) hours as needed for muscle spasms., Disp: 90 tablet, Rfl: 0 .  traZODone (DESYREL)  50 MG tablet, Take 0.5-1 tablets (25-50 mg total) by mouth at bedtime as needed for sleep. (Patient not taking: Reported on 10/08/2018), Disp: 30 tablet, Rfl: 3  Imaging Review    Lumbosacral Imaging: Lumbar MR wo contrast:  Results for orders placed during the hospital encounter of 02/28/17  MR LUMBAR SPINE WO CONTRAST   Narrative CLINICAL DATA:  53 year old male with right side lumbar back pain for 4 months without relief.  EXAM: MRI LUMBAR SPINE WITHOUT CONTRAST  TECHNIQUE: Multiplanar, multisequence MR imaging of the lumbar spine was performed. No intravenous contrast was administered.  COMPARISON:  Lumbar radiographs 07/01/2012.  FINDINGS: Segmentation:  Normal on the comparison radiographs.  Alignment: Mild straightening of lumbar lordosis. Mild retrolisthesis of L5 on S1. Subtle retrolisthesis of L4 on L5.  Vertebrae: Mild degenerative appearing endplate marrow edema at the central inferior endplate of L4. See L4-L5 findings below. Chronic degenerative endplate changes at Y2-M3. Background bone marrow signal is normal. No other acute osseous abnormality identified. Intact visible sacrum and SI joints.  Conus medullaris and cauda equina: Conus extends to the T12 level. Conus and cauda equina appear normal.  Paraspinal and other soft tissues: Negative.  Disc levels:  T12-L1 through L3-L4 are negative aside from mild disc bulging.  L4-L5: Moderate to large right paracentral disc extrusion (series 5, image 28). Superimposed circumferential disc bulge. Severe right lateral recess stenosis at the descending right L5 nerve level. Mild associated right L4 neural foraminal stenosis. Mild to moderate associated spinal stenosis. Mild left L4 neural foraminal stenosis related to combined disc bulging, endplate spurring, and mild facet hypertrophy.  L5-S1: Severe disc space loss. Vacuum disc. Circumferential disc osteophyte complex with broad-based posterior and  bilateral foraminal components. Mild facet hypertrophy. No spinal or lateral recess stenosis. Mild bilateral L5 foraminal stenosis.  IMPRESSION: 1. Bulky right paracentral disc extrusion at L4-L5 with severe stenosis at the level of the descending right L5 nerve. Mild to moderate spinal stenosis. Mild bilateral L4 foraminal stenosis. 2. Advanced chronic disc and endplate degeneration at L5-S1 with mild foraminal stenosis.   Electronically Signed   By: Genevie Ann M.D.   On: 02/28/2017 15:35    Lumbar DG 2-3 views:  Results for orders placed during the hospital encounter of 05/28/17  DG Lumbar Spine 2-3 Views   Narrative CLINICAL DATA:  Elective surgery  EXAM: LUMBAR SPINE - 2-3 VIEW; DG C-ARM 61-120 MIN  COMPARISON:  MRI 02/28/2017  FINDINGS: First lateral intraoperative spot image demonstrates posterior surgical instrument along the posterior aspect of the L5 vertebral body.  Second and 3rd lateral intraoperative images demonstrate posterior surgical instruments directed at the superior aspect of L5.  IMPRESSION: Intraoperative localization as above.   Electronically Signed   By: Rolm Baptise M.D.   On: 05/28/2017 08:35  Complexity Note: Imaging results reviewed. Results shared with Mr. Perrell, using Layman's terms.                         ROS  Cardiovascular: No reported cardiovascular signs or symptoms such as High blood pressure, coronary artery disease, abnormal heart rate or rhythm, heart attack, blood thinner therapy or heart weakness and/or failure Pulmonary or Respiratory: No reported pulmonary signs or symptoms such as wheezing and difficulty taking a deep full breath (Asthma), difficulty blowing air out (Emphysema), coughing up mucus (Bronchitis), persistent dry cough, or temporary stoppage of breathing during sleep Neurological: No reported neurological signs or symptoms such as seizures, abnormal skin sensations, urinary and/or fecal incontinence, being  born with an abnormal open spine and/or a tethered spinal cord Review of Past Neurological Studies: No results found for this or any previous visit. Psychological-Psychiatric: No reported psychological or psychiatric signs or symptoms such as difficulty sleeping, anxiety, depression, delusions or hallucinations (schizophrenial), mood swings (bipolar disorders) or suicidal ideations or attempts Gastrointestinal: Heartburn due to stomach pushing into lungs (Hiatal hernia) Genitourinary: No reported renal or genitourinary signs or symptoms such as difficulty voiding or producing urine, peeing blood, non-functioning kidney, kidney stones, difficulty emptying the bladder, difficulty controlling the flow of urine, or chronic kidney disease Hematological: No reported hematological signs or symptoms such as prolonged bleeding, low or poor functioning platelets, bruising or bleeding easily, hereditary bleeding problems, low energy levels due to low hemoglobin or being anemic Endocrine: No reported endocrine signs or symptoms such as high or low blood sugar, rapid heart rate due to high thyroid levels, obesity or weight gain due to slow thyroid or thyroid disease Rheumatologic: No reported rheumatological signs and symptoms such as fatigue, joint pain, tenderness, swelling, redness, heat, stiffness, decreased range of motion, with or without associated rash Musculoskeletal: Negative for myasthenia gravis, muscular dystrophy, multiple sclerosis or malignant hyperthermia Work History: Working full time  Allergies  Mr. Covin has No Known Allergies.  Laboratory Chemistry Profile   Screening Lab Results  Component Value Date   STAPHAUREUS NEGATIVE 05/19/2017   MRSAPCR NEGATIVE 05/19/2017    Inflammation (CRP: Acute Phase) (ESR: Chronic Phase) No results found for: CRP, ESRSEDRATE, LATICACIDVEN                       Rheumatology No results found for: RF, ANA, LABURIC, URICUR, LYMEIGGIGMAB,  LYMEABIGMQN, HLAB27                      Renal Lab Results  Component Value Date   BUN 15 05/19/2017   CREATININE 1.25 (H) 05/19/2017   GFRAA >60 05/19/2017   GFRNONAA >60 05/19/2017                             Hepatic No results found for: AST, ALT, ALBUMIN, ALKPHOS, HCVAB, AMYLASE, LIPASE, AMMONIA                      Electrolytes Lab Results  Component Value Date   NA 138 05/19/2017   K 3.6 05/19/2017   CL 106 05/19/2017   CALCIUM 9.5 05/19/2017                        Neuropathy No results found for: VITAMINB12, FOLATE, HGBA1C, HIV  CNS No results found for: COLORCSF, APPEARCSF, RBCCOUNTCSF, WBCCSF, POLYSCSF, LYMPHSCSF, EOSCSF, PROTEINCSF, GLUCCSF, JCVIRUS, CSFOLI, IGGCSF, LABACHR, ACETBL                      Bone No results found for: VD25OH, VD125OH2TOT, [id number], [id number], 25OHVITD1, 25OHVITD2, 25OHVITD3, TESTOFREE, TESTOSTERONE                       Coagulation Lab Results  Component Value Date   INR 0.96 05/19/2017   LABPROT 12.7 05/19/2017   APTT 28 05/19/2017   PLT 327 05/19/2017                        Cardiovascular Lab Results  Component Value Date   HGB 16.2 05/19/2017   HCT 49.1 05/19/2017                         ID Lab Results  Component Value Date   STAPHAUREUS NEGATIVE 05/19/2017   MRSAPCR NEGATIVE 05/19/2017    Cancer No results found for: CEA, CA125, LABCA2                      Endocrine No results found for: TSH, FREET4, TESTOFREE, TESTOSTERONE, SHBG, ESTRADIOL, ESTRADIOLPCT, ESTRADIOLFRE, LABPREG, ACTH                      Note: Lab results reviewed.  PFSH  Drug: Mr. Swiney  reports no history of drug use. Alcohol:  reports current alcohol use of about 2.0 standard drinks of alcohol per week. Tobacco:  reports that he has been smoking cigars. He has quit using smokeless tobacco.  His smokeless tobacco use included snuff. Medical:  has a past medical history of Bulging lumbar disc. Family: Family history  is unknown by patient.  Past Surgical History:  Procedure Laterality Date  . LUMBAR LAMINECTOMY/DECOMPRESSION MICRODISCECTOMY Right 05/28/2017   Procedure: LUMBAR LAMINECTOMY/DECOMPRESSION MICRODISCECTOMY 1 LEVEL-L4-5;  Surgeon: Meade Maw, MD;  Location: [facility] ORS;  Service: Neurosurgery;  Laterality: Right;   Active Ambulatory Problems    Diagnosis Date Noted  . Chronic pain syndrome 10/08/2018  . Failed back surgical syndrome 10/08/2018  . Lumbar post-laminectomy syndrome 10/08/2018   Resolved Ambulatory Problems    Diagnosis Date Noted  . No Resolved Ambulatory Problems   Past Medical History:  Diagnosis Date  . Bulging lumbar disc    Constitutional Exam  General appearance: Well nourished, well developed, and well hydrated. In no apparent acute distress Vitals:   10/08/18 0911  BP: 138/88  Pulse: 87  Resp: 16  Temp: 98.6 F (37 C)  TempSrc: Oral  SpO2: 100%  Weight: 178 lb (80.7 kg)  Height: _0  (1.778 m)   BMI Assessment: Estimated body mass index is 25.54 kg/m as calculated from the following:   Height as of this encounter: _1  (1.778 m).   Weight as of this encounter: 178 lb (80.7 kg).  BMI interpretation table: BMI level Category Range association with higher incidence of chronic pain  <18 kg/m2 Underweight   18.5-24.9 kg/m2 Ideal body weight   25-29.9 kg/m2 Overweight Increased incidence by 20%  30-34.9 kg/m2 Obese (Class I) Increased incidence by 68%  35-39.9 kg/m2 Severe obesity (Class II) Increased incidence by 136%  >40 kg/m2 Extreme obesity (Class III) Increased incidence by 254%   Patient's current BMI Ideal Body weight  Body mass index is 25.54  kg/m. Ideal body weight: 73 kg (160 lb 15 oz) Adjusted ideal body weight: 76.1 kg (167 lb 12.2 oz)   BMI Readings from Last 4 Encounters:  10/08/18 25.54 kg/m  02/27/18 27.55 kg/m  10/31/17 27.12 kg/m  05/19/17 27.69 kg/m   Wt Readings from Last 4 Encounters:  10/08/18 178 lb (80.7  kg)  02/27/18 192 lb (87.1 kg)  10/31/17 189 lb (85.7 kg)  05/19/17 193 lb (87.5 kg)  Psych/Mental status: Alert, oriented x 3 (person, place, & time)       Eyes: PERLA Respiratory: No evidence of acute respiratory distress  Cervical Spine Area Exam  Skin & Axial Inspection: No masses, redness, edema, swelling, or associated skin lesions Alignment: Symmetrical Functional ROM: Unrestricted ROM      Stability: No instability detected Muscle Tone/Strength: Functionally intact. No obvious neuro-muscular anomalies detected. Sensory (Neurological): Unimpaired Palpation: No palpable anomalies              Upper Extremity (UE) Exam    Side: Right upper extremity  Side: Left upper extremity  Skin & Extremity Inspection: Skin color, temperature, and hair growth are WNL. No peripheral edema or cyanosis. No masses, redness, swelling, asymmetry, or associated skin lesions. No contractures.  Skin & Extremity Inspection: Skin color, temperature, and hair growth are WNL. No peripheral edema or cyanosis. No masses, redness, swelling, asymmetry, or associated skin lesions. No contractures.  Functional ROM: Unrestricted ROM          Functional ROM: Unrestricted ROM          Muscle Tone/Strength: Functionally intact. No obvious neuro-muscular anomalies detected.  Muscle Tone/Strength: Functionally intact. No obvious neuro-muscular anomalies detected.  Sensory (Neurological): Unimpaired          Sensory (Neurological): Unimpaired          Palpation: No palpable anomalies              Palpation: No palpable anomalies              Provocative Test(s):  Phalen's test: deferred Tinel's test: deferred Apley's scratch test (touch opposite shoulder):  Action 1 (Across chest): deferred Action 2 (Overhead): deferred Action 3 (LB reach): deferred   Provocative Test(s):  Phalen's test: deferred Tinel's test: deferred Apley's scratch test (touch opposite shoulder):  Action 1 (Across chest): deferred Action 2  (Overhead): deferred Action 3 (LB reach): deferred    Thoracic Spine Area Exam  Skin & Axial Inspection: No masses, redness, or swelling Alignment: Symmetrical Functional ROM: Unrestricted ROM Stability: No instability detected Muscle Tone/Strength: Functionally intact. No obvious neuro-muscular anomalies detected. Sensory (Neurological): Unimpaired Muscle strength & Tone: No palpable anomalies  Lumbar Spine Area Exam  Skin & Axial Inspection: Well healed scar from previous spine surgery detected Alignment: Symmetrical Functional ROM: Pain restricted ROM with lumbar extension       Stability: No instability detected Muscle Tone/Strength: Functionally intact. No obvious neuro-muscular anomalies detected. Sensory (Neurological): Myotome pain pattern Palpation: Complains of area being tender to palpation       Provocative Tests: Hyperextension/rotation test: (+) bilaterally for facet joint pain. Lumbar quadrant test (Kemp's test): (+) bilaterally for facet joint pain. Lateral bending test: Non-contributory       Patrick's Maneuver: deferred today                   FABER* test: deferred today                   S-I anterior distraction/compression test: deferred  today         S-I lateral compression test: deferred today         S-I Thigh-thrust test: deferred today         S-I Gaenslen's test: deferred today         *(Flexion, ABduction and External Rotation)  Gait & Posture Assessment  Ambulation: Unassisted Gait: Relatively normal for age and body habitus Posture: WNL   Lower Extremity Exam    Side: Right lower extremity  Side: Left lower extremity  Stability: No instability observed          Stability: No instability observed          Skin & Extremity Inspection: Skin color, temperature, and hair growth are WNL. No peripheral edema or cyanosis. No masses, redness, swelling, asymmetry, or associated skin lesions. No contractures.  Skin & Extremity Inspection: Skin color,  temperature, and hair growth are WNL. No peripheral edema or cyanosis. No masses, redness, swelling, asymmetry, or associated skin lesions. No contractures.  Functional ROM: Unrestricted ROM                  Functional ROM: Unrestricted ROM                  Muscle Tone/Strength: Functionally intact. No obvious neuro-muscular anomalies detected.  Muscle Tone/Strength: Functionally intact. No obvious neuro-muscular anomalies detected.  Sensory (Neurological): Unimpaired        Sensory (Neurological): Unimpaired        DTR: Patellar: deferred today Achilles: deferred today Plantar: deferred today  DTR: Patellar: deferred today Achilles: deferred today Plantar: deferred today  Palpation: No palpable anomalies  Palpation: No palpable anomalies   Assessment  Primary Diagnosis & Pertinent Problem List: The primary encounter diagnosis was Chronic pain syndrome. Diagnoses of Failed back surgical syndrome, Lumbar post-laminectomy syndrome, Musculoskeletal pain, and S/P lumbar microdiscectomy were also pertinent to this visit.  Visit Diagnosis (New problems to examiner): 1. Chronic pain syndrome   2. Failed back surgical syndrome   3. Lumbar post-laminectomy syndrome   4. Musculoskeletal pain   5. S/P lumbar microdiscectomy    Plan of Care (Initial workup plan)  Note: Mr. Formby was reminded that as per protocol, today's visit has been an evaluation only. We have not taken over the patient's controlled substance management. General Recommendations: The pain condition that the patient suffers from is best treated with a multidisciplinary approach that involves an increase in physical activity to prevent de-conditioning and worsening of the pain cycle, as well as psychological counseling (formal and/or informal) to address the co-morbid psychological affects of pain. Treatment will often involve judicious use of pain medications and interventional procedures to decrease the pain, allowing the  patient to participate in the physical activity that will ultimately produce long-lasting pain reductions. The goal of the multidisciplinary approach is to return the patient to a higher level of overall function and to restore their ability to perform activities of daily living.   Patient is a pleasant 53 year old male who is status post L4-L5 microdiscectomy, L4-L5 decompression for lumbar radiculopathy.  This was done on 05/28/2017.  Patient presents with axial low back pain that presents in a bandlike pattern.  Patient denies radiation to his lower extremities.  Patient denies any bowel or bladder dysfunction. He is being referred for consideration of spinal cord stimulator trial by Dr. Cari Caraway.  After having a discussion with Maxwell [name], he states that he is not interested in spinal cord stimulation nor is he  interested in any interventional pain procedures as he states "I do not do needles".  We will be primarily focusing on medication management as the patient is not interested in any interventional pain therapies that I have to offer.  Patient is on Robaxin at 500 mg twice daily and does find some benefit with that but it does not last long enough.  Patient is not on any opioid therapy.  He denies having tried gabapentin.  Plan: -Increase Robaxin to 750 mg 3 times daily as needed -Start gabapentin as below -Urine drug screen for possible consideration of opioid analgesics if non-opioid analgesics and PT are not helpful at managing his pain.   Lab Orders     Compliance Drug Analysis, Ur Pharmacotherapy (current): Medications ordered:  Meds ordered this encounter  Medications  . methocarbamol (ROBAXIN) 750 MG tablet    Sig: Take 1 tablet (750 mg total) by mouth every 8 (eight) hours as needed for muscle spasms.    Dispense:  90 tablet    Refill:  0    Do not place this medication, or any other prescription from our practice, on "Automatic Refill". Patient may have prescription filled  one day early if pharmacy is closed on scheduled refill date.  . gabapentin (NEURONTIN) 300 MG capsule    Sig: Take 1 capsule (300 mg total) by mouth at bedtime for 30 days, THEN 2 capsules (600 mg total) at bedtime.    Dispense:  90 capsule    Refill:  0   Medications administered during this visit: Cain Sieve had no medications administered during this visit.   Pharmacological management options:  Opioid Analgesics: The patient was informed that there is no guarantee that he would be a candidate for opioid analgesics. The decision will be made following CDC guidelines. This decision will be based on the results of diagnostic studies, as well as Mr. Vandunk risk profile.   Membrane stabilizer: Start gabapentin as above  Muscle relaxant: To be determined at a later time  NSAID: To be determined at a later time  Other analgesic(s): To be determined at a later time    Provider-requested follow-up: Return in about 8 weeks (around 12/03/2018) for Medication Management, virtual.  Future Appointments  Date Time Provider [place]  11/19/2018  8:00 AM Gillis Santa, MD [facility] None    Primary Care Physician: Juline Patch, MD Location: [facility] Note by: Gillis Santa, MD Date: 10/08/2018; Time: 4:42 PM  Note: This dictation was prepared with Dragon dictation. Any transcriptional errors that may result from this process are unintentional.

## 2018-10-08 NOTE — Progress Notes (Signed)
Safety precautions to be maintained throughout the outpatient stay will include: orient to surroundings, keep bed in low position, maintain call bell within reach at all times, provide assistance with transfer out of bed and ambulation.  

## 2018-10-13 LAB — COMPLIANCE DRUG ANALYSIS, UR

## 2018-10-14 ENCOUNTER — Other Ambulatory Visit: Payer: Self-pay

## 2018-10-14 ENCOUNTER — Encounter: Payer: Self-pay | Admitting: Family Medicine

## 2018-10-14 ENCOUNTER — Ambulatory Visit: Payer: BC Managed Care – PPO | Admitting: Family Medicine

## 2018-10-14 VITALS — BP 117/80 | HR 80 | Resp 16 | Ht 70.0 in | Wt 176.0 lb

## 2018-10-14 DIAGNOSIS — Z1211 Encounter for screening for malignant neoplasm of colon: Secondary | ICD-10-CM

## 2018-10-14 NOTE — Progress Notes (Signed)
Date:  10/14/2018   Name:  Maxwell [name]   DOB:  1965/12/29   MRN:  [id number]   Chief Complaint: GI Problem (wants colin screening just to get it done )  Patient is a 53 year old male who presents for a preevaluation for colonoscopy exam. The patient reports the following problems: none. Health maintenance has been reviewed needs immunization/refuses "don't do needles"  GI Problem Primary symptoms do not include fever, weight loss, fatigue, abdominal pain, nausea, vomiting, diarrhea, melena, hematemesis, jaundice, hematochezia, dysuria, myalgias, arthralgias or rash.  The illness does not include chills, anorexia, dysphagia, odynophagia, bloating, constipation, tenesmus, back pain or itching. Associated medical issues comments: no disease.    Review of Systems  Constitutional: Negative for chills, fatigue, fever and weight loss.  HENT: Negative for drooling, ear discharge, ear pain and sore throat.   Respiratory: Negative for cough, shortness of breath and wheezing.   Cardiovascular: Negative for chest pain, palpitations and leg swelling.  Gastrointestinal: Negative for abdominal pain, anorexia, bloating, blood in stool, constipation, diarrhea, dysphagia, hematemesis, hematochezia, jaundice, melena, nausea and vomiting.  Endocrine: Negative for polydipsia.  Genitourinary: Negative for dysuria, frequency, hematuria and urgency.  Musculoskeletal: Negative for arthralgias, back pain, myalgias and neck pain.  Skin: Negative for itching and rash.  Allergic/Immunologic: Negative for environmental allergies.  Neurological: Negative for dizziness and headaches.  Hematological: Does not bruise/bleed easily.  Psychiatric/Behavioral: Negative for suicidal ideas. The patient is not nervous/anxious.     Patient Active Problem List   Diagnosis Date Noted  . Chronic pain syndrome 10/08/2018  . Failed back surgical syndrome 10/08/2018  . Lumbar post-laminectomy syndrome 10/08/2018    No  Known Allergies  Past Surgical History:  Procedure Laterality Date  . LUMBAR LAMINECTOMY/DECOMPRESSION MICRODISCECTOMY Right 05/28/2017   Procedure: LUMBAR LAMINECTOMY/DECOMPRESSION MICRODISCECTOMY 1 LEVEL-L4-5;  Surgeon: Meade Maw, MD;  Location: [facility] ORS;  Service: Neurosurgery;  Laterality: Right;    Social History   Tobacco Use  . Smoking status: Current Some Day Smoker    Types: Cigars  . Smokeless tobacco: Former Systems developer    Types: Snuff  . Tobacco comment: counseled concerning meds and patches  Substance Use Topics  . Alcohol use: Yes    Alcohol/week: 2.0 standard drinks    Types: 2 Cans of beer per week  . Drug use: No     Medication list has been reviewed and updated.  Current Meds  Medication Sig  . gabapentin (NEURONTIN) 300 MG capsule Take 1 capsule (300 mg total) by mouth at bedtime for 30 days, THEN 2 capsules (600 mg total) at bedtime.  . methocarbamol (ROBAXIN) 750 MG tablet Take 1 tablet (750 mg total) by mouth every 8 (eight) hours as needed for muscle spasms.  . Multiple Vitamins-Minerals (MULTIVITAMIN PO) Take 1 tablet by mouth daily.  . naproxen sodium (ALEVE) 220 MG tablet Take 440 mg by mouth daily as needed (for pain or headache).  . tadalafil (ADCIRCA/CIALIS) 20 MG tablet TAKE 1 TABLET DAILY AS NEEDED FOR ERECTILE DYSFUNCTION  . [DISCONTINUED] traZODone (DESYREL) 50 MG tablet Take 0.5-1 tablets (25-50 mg total) by mouth at bedtime as needed for sleep.    PHQ 2/9 Scores 10/14/2018 05/13/2017 07/07/2015  PHQ - 2 Score 0 0 0  PHQ- 9 Score - 2 -    BP Readings from Last 3 Encounters:  10/14/18 117/80  10/08/18 138/88  02/27/18 120/80    Physical Exam Vitals signs and nursing note reviewed.  HENT:  Head: Normocephalic.     Right Ear: Tympanic membrane, ear canal and external ear normal.     Left Ear: Tympanic membrane, ear canal and external ear normal.     Nose: Nose normal. No congestion or rhinorrhea.  Eyes:     General: No scleral  icterus.       Right eye: No discharge.        Left eye: No discharge.     Conjunctiva/sclera: Conjunctivae normal.     Pupils: Pupils are equal, round, and reactive to light.  Neck:     Musculoskeletal: Normal range of motion and neck supple.     Thyroid: No thyromegaly.     Vascular: No JVD.     Trachea: No tracheal deviation.  Cardiovascular:     Rate and Rhythm: Normal rate and regular rhythm.     Pulses: Normal pulses.     Heart sounds: Normal heart sounds. No murmur. No friction rub. No gallop.   Pulmonary:     Effort: Pulmonary effort is normal. No respiratory distress.     Breath sounds: Normal breath sounds. No wheezing, rhonchi or rales.  Abdominal:     General: Bowel sounds are normal.     Palpations: Abdomen is soft. There is no mass.     Tenderness: There is no abdominal tenderness. There is no guarding or rebound.  Musculoskeletal: Normal range of motion.        General: No tenderness.  Lymphadenopathy:     Cervical: No cervical adenopathy.  Skin:    General: Skin is warm.     Findings: No rash.  Neurological:     Mental Status: He is alert and oriented to person, place, and time.     Cranial Nerves: No cranial nerve deficit.     Deep Tendon Reflexes: Reflexes are normal and symmetric.     Wt Readings from Last 3 Encounters:  10/14/18 176 lb (79.8 kg)  10/08/18 178 lb (80.7 kg)  02/27/18 192 lb (87.1 kg)    BP 117/80   Pulse 80   Resp 16   Ht 5\' 10"  (1.778 m)   Wt 176 lb (79.8 kg)   SpO2 98%   BMI 25.25 kg/m   Assessment and Plan:  1. Colon cancer screening Presents today for face-to-face for for colonoscopy screening.  We will prep is that the patient's cohort needles and will not take shots of any kind.  Patient is also ultra conservative about his COVID exposure and and rarely leaves the house other than to go to work.  We have discussed the timing of colonoscopy and patient is not having any symptomatology.  We will give him Hemoccult cards x3  and will check it for blood if it is positive for blood and we will proceed with colon screening at this time however in the meantime patient is going to consider the possibility of delaying his colonoscopy for 6 months to a year if his guaiac cards are negative.

## 2018-10-14 NOTE — Patient Instructions (Addendum)
Colonoscopy, Adult A colonoscopy is an exam to look at the entire large intestine. During the exam, a lubricated, flexible tube that has a camera on the end of it is inserted into the anus and then passed into the rectum, colon, and other parts of the large intestine. You may have a colonoscopy as a part of normal colorectal screening or if you have certain symptoms, such as:  Lack of red blood cells (anemia).  Diarrhea that does not go away.  Abdominal pain.  Blood in your stool (feces). A colonoscopy can help screen for and diagnose medical problems, including:  Tumors.  Polyps.  Inflammation.  Areas of bleeding. Tell a health care provider about:  Any allergies you have.  All medicines you are taking, including vitamins, herbs, eye drops, creams, and over-the-counter medicines.  Any problems you or family members have had with anesthetic medicines.  Any blood disorders you have.  Any surgeries you have had.  Any medical conditions you have.  Any problems you have had passing stool. What are the risks? Generally, this is a safe procedure. However, problems may occur, including:  Bleeding.  A tear in the intestine.  A reaction to medicines given during the exam.  Infection (rare). What happens before the procedure? Eating and drinking restrictions Follow instructions from your health care provider about eating and drinking, which may include:  A few days before the procedure - follow a low-fiber diet. Avoid nuts, seeds, dried fruit, raw fruits, and vegetables.  1-3 days before the procedure - follow a clear liquid diet. Drink only clear liquids, such as clear broth or bouillon, black coffee or tea, clear juice, clear soft drinks or sports drinks, gelatin dessert, and popsicles. Avoid any liquids that contain red or purple dye.  On the day of the procedure - do not eat or drink anything starting 2 hours before the procedure, or within the time period that your  health care provider recommends. Up to 2 hours before the procedure, you may continue to drink clear liquids, such as water or clear fruit juice. Bowel prep If you were prescribed an oral bowel prep to clean out your colon:  Take it as told by your health care provider. Starting the day before your procedure, you will need to drink a large amount of medicated liquid. The liquid will cause you to have multiple loose stools until your stool is almost clear or light green.  If your skin or anus gets irritated from diarrhea, you may use these to relieve the irritation: ? Medicated wipes, such as adult wet wipes with aloe and vitamin E. ? A skin-soothing product like petroleum jelly.  If you vomit while drinking the bowel prep, take a break for up to 60 minutes and then begin the bowel prep again. If vomiting continues and you cannot take the bowel prep without vomiting, call your health care provider.  To clean out your colon, you may also be given: ? Laxative medicines. ? Instructions about how to use an enema. General instructions  Ask your health care provider about: ? Changing or stopping your regular medicines or supplements. This is especially important if you are taking iron supplements, diabetes medicines, or blood thinners. ? Taking medicines such as aspirin and ibuprofen. These medicines can thin your blood. Do not take these medicines before the procedure if your health care provider tells you not to.  Plan to have someone take you home from the hospital or clinic. What happens during the procedure?     An IV may be inserted into one of your veins.  You will be given medicine to help you relax (sedative).  To reduce your risk of infection: ? Your health care team will wash or sanitize their hands. ? Your anal area will be washed with soap.  You will be asked to lie on your side with your knees bent.  Your health care provider will lubricate a long, thin, flexible tube. The  tube will have a camera and a light on the end.  The tube will be inserted into your anus.  The tube will be gently eased through your rectum and colon.  Air will be delivered into your colon to keep it open. You may feel some pressure or cramping.  The camera will be used to take images during the procedure.  A small tissue sample may be removed to be examined under a microscope (biopsy).  If small polyps are found, your health care provider may remove them and have them checked for cancer cells.  When the exam is done, the tube will be removed. The procedure may vary among health care providers and hospitals. What happens after the procedure?  Your blood pressure, heart rate, breathing rate, and blood oxygen level will be monitored until the medicines you were given have worn off.  Do not drive for 24 hours after the exam.  You may have a small amount of blood in your stool.  You may pass gas and have mild abdominal cramping or bloating due to the air that was used to inflate your colon during the exam.  It is up to you to get the results of your procedure. Ask your health care provider, or the department performing the procedure, when your results will be ready. Summary  A colonoscopy is an exam to look at the entire large intestine.  During a colonoscopy, a lubricated, flexible tube with a camera on the end of it is inserted into the anus and then passed into the colon and other parts of the large intestine.  Follow instructions from your health care provider about eating and drinking before the procedure.  If you were prescribed an oral bowel prep to clean out your colon, take it as told by your health care provider.  After your procedure, your blood pressure, heart rate, breathing rate, and blood oxygen level will be monitored until the medicines you were given have worn off. This information is not intended to replace advice given to you by your health care provider. Make  sure you discuss any questions you have with your health care provider. Document Released: 01/26/2000 Document Revised: 11/20/2016 Document Reviewed: 04/11/2015 Elsevier Patient Education  2020 [employer]  Mediterranean Diet A Mediterranean diet refers to food and lifestyle choices that are based on the traditions of countries located on the [place]. This way of eating has been shown to help prevent certain conditions and improve outcomes for people who have chronic diseases, like kidney disease and heart disease. What are tips for following this plan? Lifestyle  Cook and eat meals together with your family, when possible.  Drink enough fluid to keep your urine clear or pale yellow.  Be physically active every day. This includes: ? Aerobic exercise like running or swimming. ? Leisure activities like gardening, walking, or housework.  Get 7-8 hours of sleep each night.  If recommended by your health care provider, drink red wine in moderation. This means 1 glass a day for nonpregnant women and 2 glasses  a day for men. A glass of wine equals 5 oz (150 mL). Reading food labels   Check the serving size of packaged foods. For foods such as rice and pasta, the serving size refers to the amount of cooked product, not dry.  Check the total fat in packaged foods. Avoid foods that have saturated fat or trans fats.  Check the ingredients list for added sugars, such as corn syrup. Shopping  At the grocery store, buy most of your food from the areas near the walls of the store. This includes: ? Fresh fruits and vegetables (produce). ? Grains, beans, nuts, and seeds. Some of these may be available in unpackaged forms or large amounts (in bulk). ? Fresh seafood. ? Poultry and eggs. ? Low-fat dairy products.  Buy whole ingredients instead of prepackaged foods.  Buy fresh fruits and vegetables in-season from local farmers markets.  Buy frozen fruits and vegetables in resealable  bags.  If you do not have access to quality fresh seafood, buy precooked frozen shrimp or canned fish, such as tuna, salmon, or sardines.  Buy small amounts of raw or cooked vegetables, salads, or olives from the deli or salad bar at your store.  Stock your pantry so you always have certain foods on hand, such as olive oil, canned tuna, canned tomatoes, rice, pasta, and beans. Cooking  Cook foods with extra-virgin olive oil instead of using butter or other vegetable oils.  Have meat as a side dish, and have vegetables or grains as your main dish. This means having meat in small portions or adding small amounts of meat to foods like pasta or stew.  Use beans or vegetables instead of meat in common dishes like chili or lasagna.  Experiment with different cooking methods. Try roasting or broiling vegetables instead of steaming or sauteing them.  Add frozen vegetables to soups, stews, pasta, or rice.  Add nuts or seeds for added healthy fat at each meal. You can add these to yogurt, salads, or vegetable dishes.  Marinate fish or vegetables using olive oil, lemon juice, garlic, and fresh herbs. Meal planning   Plan to eat 1 vegetarian meal one day each week. Try to work up to 2 vegetarian meals, if possible.  Eat seafood 2 or more times a week.  Have healthy snacks readily available, such as: ? Vegetable sticks with hummus. ? [place] yogurt. ? Fruit and nut trail mix.  Eat balanced meals throughout the week. This includes: ? Fruit: 2-3 servings a day ? Vegetables: 4-5 servings a day ? Low-fat dairy: 2 servings a day ? Fish, poultry, or lean meat: 1 serving a day ? Beans and legumes: 2 or more servings a week ? Nuts and seeds: 1-2 servings a day ? Whole grains: 6-8 servings a day ? Extra-virgin olive oil: 3-4 servings a day  Limit red meat and sweets to only a few servings a month What are my food choices?  Mediterranean diet ? Recommended  Grains: Whole-grain pasta. Brown  rice. Bulgar wheat. Polenta. Couscous. Whole-wheat bread. [place].  Vegetables: Artichokes. Beets. Broccoli. Cabbage. Carrots. Eggplant. Green beans. Chard. Kale. Spinach. Onions. Leeks. Peas. Squash. Tomatoes. Peppers. Radishes.  Fruits: Apples. Apricots. Avocado. Berries. Bananas. Cherries. Dates. Figs. Grapes. Lemons. Melon. Oranges. Peaches. Plums. Pomegranate.  Meats and other protein foods: Beans. Almonds. Sunflower seeds. Pine nuts. Peanuts. [place]. Salmon. Scallops. Shrimp. [place]. Tilapia. Clams. Oysters. Eggs.  Dairy: Low-fat milk. Cheese. Greek yogurt.  Beverages: Water. Red wine. Herbal tea.  Fats and oils:  Extra virgin olive oil. Avocado oil. Grape seed oil.  Sweets and desserts: [place] yogurt with honey. Baked apples. Poached pears. Trail mix.  Seasoning and other foods: Basil. Cilantro. Coriander. Cumin. Mint. Parsley. Sage. Rosemary. Tarragon. Garlic. Oregano. Thyme. Pepper. Balsalmic vinegar. Tahini. Hummus. Tomato sauce. Olives. Mushrooms. ? Limit these  Grains: Prepackaged pasta or rice dishes. Prepackaged cereal with added sugar.  Vegetables: Deep fried potatoes (french fries).  Fruits: Fruit canned in syrup.  Meats and other protein foods: Beef. Pork. Lamb. Poultry with skin. Hot dogs. Tomas[place] Blase.  Dairy: Ice cream. Sour cream. Whole milk.  Beverages: Juice. Sugar-sweetened soft drinks. Beer. Liquor and spirits.  Fats and oils: Butter. Canola oil. Vegetable oil. Beef fat (tallow). Lard.  Sweets and desserts: Cookies. Cakes. Pies. Candy.  Seasoning and other foods: Mayonnaise. Premade sauces and marinades. The items listed may not be a complete list. Talk with your dietitian about what dietary choices are right for you. Summary  The Mediterranean diet includes both food and lifestyle choices.  Eat a variety of fresh fruits and vegetables, beans, nuts, seeds, and whole grains.  Limit the amount of red meat and sweets that you eat.  Talk with your health  care provider about whether it is safe for you to drink red wine in moderation. This means 1 glass a day for nonpregnant women and 2 glasses a day for men. A glass of wine equals 5 oz (150 mL). This information is not intended to replace advice given to you by your health care provider. Make sure you discuss any questions you have with your health care provider. Document Released: 09/21/2015 Document Revised: 09/28/2015 Document Reviewed: 09/21/2015 Elsevier Patient Education  2020 [employer].

## 2018-10-16 ENCOUNTER — Other Ambulatory Visit: Payer: Self-pay | Admitting: Neurosurgery

## 2018-10-16 ENCOUNTER — Other Ambulatory Visit (HOSPITAL_COMMUNITY): Payer: Self-pay | Admitting: Neurosurgery

## 2018-10-16 DIAGNOSIS — M5440 Lumbago with sciatica, unspecified side: Secondary | ICD-10-CM

## 2018-10-16 DIAGNOSIS — G8929 Other chronic pain: Secondary | ICD-10-CM

## 2018-10-22 ENCOUNTER — Other Ambulatory Visit (INDEPENDENT_AMBULATORY_CARE_PROVIDER_SITE_OTHER): Payer: BC Managed Care – PPO

## 2018-10-22 ENCOUNTER — Other Ambulatory Visit: Payer: Self-pay

## 2018-10-22 DIAGNOSIS — Z1211 Encounter for screening for malignant neoplasm of colon: Secondary | ICD-10-CM | POA: Diagnosis not present

## 2018-10-22 LAB — HEMOCCULT GUIAC POC 1CARD (OFFICE)
Card #1 Date: 9102020
Card #2 Date: 9102020
Card #2 Fecal Occult Blod, POC: NEGATIVE
Card #3 Date: 9102020
Card #3 Fecal Occult Blood, POC: NEGATIVE
Fecal Occult Blood, POC: NEGATIVE

## 2018-10-22 NOTE — Progress Notes (Signed)
.  hem

## 2018-10-29 ENCOUNTER — Ambulatory Visit
Admission: RE | Admit: 2018-10-29 | Discharge: 2018-10-29 | Disposition: A | Discharge status: Home / Self Care | Payer: BC Managed Care – PPO | Source: Ambulatory Visit | Attending: Neurosurgery | Admitting: Neurosurgery

## 2018-10-29 ENCOUNTER — Other Ambulatory Visit: Payer: Self-pay

## 2018-10-29 ENCOUNTER — Other Ambulatory Visit: Payer: Self-pay | Admitting: Neurosurgery

## 2018-10-29 DIAGNOSIS — M5440 Lumbago with sciatica, unspecified side: Secondary | ICD-10-CM | POA: Diagnosis not present

## 2018-10-29 DIAGNOSIS — G8929 Other chronic pain: Secondary | ICD-10-CM | POA: Diagnosis not present

## 2018-10-29 DIAGNOSIS — M545 Low back pain: Secondary | ICD-10-CM | POA: Diagnosis not present

## 2018-11-10 ENCOUNTER — Other Ambulatory Visit: Payer: Self-pay | Admitting: Student in an Organized Health Care Education/Training Program

## 2018-11-18 ENCOUNTER — Telehealth: Payer: Self-pay | Admitting: *Deleted

## 2018-11-18 NOTE — Telephone Encounter (Signed)
Attempted to call for pre appointment review of allergies/meds. Message left. 

## 2018-11-19 ENCOUNTER — Ambulatory Visit
Payer: BC Managed Care – PPO | Attending: Student in an Organized Health Care Education/Training Program | Admitting: Student in an Organized Health Care Education/Training Program

## 2018-11-19 ENCOUNTER — Encounter: Payer: Self-pay | Admitting: Student in an Organized Health Care Education/Training Program

## 2018-11-19 ENCOUNTER — Other Ambulatory Visit: Payer: Self-pay

## 2018-11-19 DIAGNOSIS — M961 Postlaminectomy syndrome, not elsewhere classified: Secondary | ICD-10-CM

## 2018-11-19 DIAGNOSIS — M47816 Spondylosis without myelopathy or radiculopathy, lumbar region: Secondary | ICD-10-CM

## 2018-11-19 DIAGNOSIS — Z9889 Other specified postprocedural states: Secondary | ICD-10-CM | POA: Diagnosis not present

## 2018-11-19 MED ORDER — PREGABALIN 50 MG PO CAPS: ORAL_CAPSULE | ORAL | 0 refills | Status: DC

## 2018-11-19 MED ORDER — METHOCARBAMOL 750 MG PO TABS: 1125.0000 mg | ORAL_TABLET | Freq: Three times a day (TID) | ORAL | 2 refills | Status: DC | PRN

## 2018-11-19 NOTE — Addendum Note (Signed)
Addended by: Gillis Santa on: 11/19/2018 10:55 AM   Modules accepted: Orders

## 2018-11-19 NOTE — Progress Notes (Addendum)
Pain Management Virtual Encounter Note - Virtual Visit via Telephone Telehealth (real-time audio visits between healthcare provider and patient).   Patient's Phone No. & Preferred Pharmacy:  [phone] (home); [phone] (mobile); (Preferred) [phone] [email or web address]  [facility] [phone] Dan Humphreys, [place] - [address] [place] [address] Phone: [phone] Fax: [phone]  [facility] - [place], [place] - [address] [place] [address] Phone: [phone] Fax: [phone]    Pre-screening note:  Our staff contacted Maxwell [name] and offered him an "in person", "face-to-face" appointment versus a telephone encounter. He indicated preferring the telephone encounter, at this time.   Reason for Virtual Visit: COVID-19*  Social distancing based on CDC and AMA recommendations.   I contacted Maxwell [name] on 11/19/2018 via telephone.      I clearly identified myself as Edward Jolly, MD. I verified that I was speaking with the correct person using two identifiers (Name: Maxwell [name], and date of birth: 24-Mar-1965).  Advanced Informed Consent I sought verbal advanced consent from Maxwell [name] for virtual visit interactions. I informed Maxwell [name] of possible security and privacy concerns, risks, and limitations associated with providing "not-in-person" medical evaluation and management services. I also informed Maxwell [name] of the availability of "in-person" appointments. Finally, I informed him that there would be a charge for the virtual visit and that he could be  personally, fully or partially, financially responsible for it. Maxwell [name] expressed understanding and agreed to proceed.   Historic Elements   Maxwell [name] is a 53 y.o. year old, male patient evaluated today after his last encounter by our practice on 11/18/2018. Maxwell [name]  has a past medical history of Bulging lumbar disc. He also   has a past surgical history that includes Lumbar laminectomy/decompression microdiscectomy (Right, 05/28/2017). Maxwell [name] has a current medication list which includes the following prescription(s): methocarbamol, multiple vitamin, naproxen sodium, pregabalin, and tadalafil. He  reports that he has been smoking cigars. He has quit using smokeless tobacco.  His smokeless tobacco use included snuff. He reports current alcohol use of about 2.0 standard drinks of alcohol per week. He reports that he does not use drugs. Maxwell [name] has No Known Allergies.   HPI  Today, he is being contacted for medication management.   Patient follows up today for his second patient visit, virtual via telephone.  Patient completed lumbar MRI with results below.  Patient continues to have significant low back and right greater than left buttock and lateral thigh pain.  Patient is noticing some benefit with Robaxin that I prescribed and I recommend that we increase his dose to thousand milligrams 3 times daily as needed.  He is not finding any benefit with gabapentin.  I recommend that he start Lyrica as below.  Robaxin can be refilled via PCP since it is not controlled.  I informed the patient that if he changes his mind about interventional therapies such as epidural injection and or spinal cord stimulation that he is welcome to contact our office.  Patient endorsed understanding.  UDS:  Summary  Date Value Ref Range Status  10/08/2018 Note  Final    Comment:    ==================================================================== Compliance Drug Analysis, Ur ==================================================================== Test                             Result       Flag  Units Drug Present and Declared for Prescription Verification   Methocarbamol                  PRESENT      EXPECTED Drug Present not Declared for Prescription Verification   Diphenhydramine                PRESENT      UNEXPECTED Drug  Absent but Declared for Prescription Verification   Gabapentin                     Not Detected UNEXPECTED   Trazodone                      Not Detected UNEXPECTED   Naproxen                       Not Detected UNEXPECTED ==================================================================== Test                      Result    Flag   Units      Ref Range   Creatinine              321              mg/dL      >=16>=20 ==================================================================== Declared Medications:  The flagging and interpretation on this report are based on the  following declared medications.  Unexpected results may arise from  inaccuracies in the declared medications.  **Note: The testing scope of this panel includes these medications:  Gabapentin  Methocarbamol  Naproxen  Trazodone  **Note: The testing scope of this panel does not include the  following reported medications:  Multivitamin  Tadalafil ==================================================================== For clinical consultation, please call [phone]. ====================================================================     Imaging  Last 90 days:  Maxwell [name]  Result Date: 10/30/2018 CLINICAL DATA:  Chronic bilateral low back pain with sciatica, sciatica laterality unspecified. EXAM: MRI LUMBAR SPINE WITHOUT [name] TECHNIQUE: Multiplanar, multisequence Maxwell imaging of the lumbar spine was performed. No intravenous [name] was administered. COMPARISON:  Lumbar spine MRI 02/28/2017 FINDINGS: Please note the examination was ordered without and with [name]. However, the patient refused [name] at the time of examination. Segmentation: For the purposes of this dictation, five lumbar vertebrae are assumed and the caudal most well-formed intervertebral disc is designated L5-S1. Alignment: Straightening of the expected lumbar lordosis. Trace L4-L5 retrolisthesis. Mild L5-S1 grade 1 retrolisthesis.  Vertebrae: Vertebral body height is maintained. No suspicious osseous lesions. Degenerative endplate irregularity and mild mixed degenerative endplate marrow signal at L4-L5 and L5-S1, including degenerative endplate edema. Conus medullaris and cauda equina: Conus extends to the T12-L1 level. Conus and cauda equina appear normal. Paraspinal and other soft tissues: Imaged paraspinal soft tissues unremarkable. Included portions of the abdomen unremarkable. Disc levels: Moderate L4-L5 disc degeneration. Severe L5-S1 disc degeneration. Mild disc degeneration at the remaining levels. Unless otherwise stated, the level by level findings below have not significantly changed since prior MRI 02/28/2017. No significant posterior disc herniation, spinal canal stenosis or neural foraminal narrowing at the T12-L1 through L2-L3 levels L3-L4: Small disc bulge. No significant spinal canal stenosis or neural foraminal narrowing. L4-L5: Sequela of interval discectomy. A previously demonstrated moderate to large right center disc extrusion has markedly decreased in size. Disc bulge with endplate spurring. Mild facet arthrosis. There is a persistent small focus of signal abnormality within the right subarticular zone with contact upon  the descending right L5 nerve root, which likely reflects a small residual/recurrent disc extrusion. Marked interval decrease in previously demonstrated right subarticular stenosis. No significant central canal stenosis remains. Mild bilateral neural foraminal narrowing. L5-S1: Circumferential disc osteophyte complex with superimposed broad-based central disc protrusion mild facet arthrosis. The disc bulge/protrusion contributes to mild bilateral subarticular narrowing with possible contact upon the bilateral descending S1 nerve roots. No significant central canal stenosis. Mild bilateral foraminal stenosis. IMPRESSION: At L4-L5, sequela of interval discectomy. Persistent small focus of signal abnormality  within the right subarticular zone with contact upon the descending right L5 nerve root, likely reflecting a small residual/recurrent disc extrusion. No significant central canal stenosis remains. Mild bilateral neural foraminal narrowing. Unchanged L5-S1 spondylosis, which include severe disc height loss, circumferential disc osteophyte complex, broad-based central disc protrusion and mild facet arthrosis. Mild bilateral subarticular stenosis and neural foraminal narrowing. Electronically Signed   By: Kellie Simmering   On: 10/30/2018 10:47    Assessment  The primary encounter diagnosis was Lumbar post-laminectomy syndrome. Diagnoses of Failed back surgical syndrome, S/P lumbar microdiscectomy, and Lumbar spondylosis were also pertinent to this visit.  Plan of Care  I have discontinued Maxwell [name]'s gabapentin. I have also changed his methocarbamol. Additionally, I am having him maintain his Multiple Vitamins-Minerals (MULTIVITAMIN PO), naproxen sodium, tadalafil, and pregabalin.  Pharmacotherapy (Medications Ordered): Meds ordered this encounter  Medications  . methocarbamol (ROBAXIN) 750 MG tablet    Sig: Take 1.5 tablets (1,125 mg total) by mouth every 8 (eight) hours as needed for muscle spasms.    Dispense:  90 tablet    Refill:  2    Do not place this medication, or any other prescription from our practice, on "Automatic Refill". Patient may have prescription filled one day early if pharmacy is closed on scheduled refill date.  Marland Kitchen DISCONTD: pregabalin (LYRICA) 50 MG capsule    Sig: Take 1 capsule (50 mg total) by mouth at bedtime for 20 days, THEN 1 capsule (50 mg total) 2 (two) times daily.    Dispense:  140 capsule    Refill:  0    Do not place this medication, or any other prescription from our practice, on "Automatic Refill". Patient may have prescription filled one day early if pharmacy is closed on scheduled refill date.  . pregabalin (LYRICA) 50 MG capsule    Sig: Take 1 capsule  (50 mg total) by mouth at bedtime for 20 days, THEN 1 capsule (50 mg total) 2 (two) times daily.    Dispense:  140 capsule    Refill:  0    Do not place this medication, or any other prescription from our practice, on "Automatic Refill". Patient may have prescription filled one day early if pharmacy is closed on scheduled refill date.   Follow-up plan:   Return if symptoms worsen or fail to improve.     No benefit with gabapentin.  Prescription for Lyrica as above.  Increased dose of Robaxin which was providing him with benefit.  Not interested in any interventional therapies.   Recent Visits Date Type Provider Dept  10/08/18 Office Visit Gillis Santa, MD [facility]  Showing recent visits within past 90 days and meeting all other requirements   Today's Visits Date Type Provider Dept  11/19/18 Office Visit Gillis Santa, MD [facility]  Showing today's visits and meeting all other requirements   Future Appointments No visits were found meeting these conditions.  Showing future appointments within next 90  days and meeting all other requirements   I discussed the assessment and treatment plan with the patient. The patient was provided an opportunity to ask questions and all were answered. The patient agreed with the plan and demonstrated an understanding of the instructions.  Patient advised to call back or seek an in-person evaluation if the symptoms or condition worsens.  Total duration of non-face-to-face encounter:25 minutes.  Note by: Edward Jolly, MD Date: 11/19/2018; Time: 10:55 AM  Note: This dictation was prepared with Dragon dictation. Any transcriptional errors that may result from this process are unintentional.  Disclaimer:  * Given the special circumstances of the COVID-19 pandemic, the federal government has announced that the Office for Civil Rights (OCR) will exercise its enforcement discretion and will not impose penalties on physicians using  telehealth in the event of noncompliance with regulatory requirements under the [employer] Portability and Accountability Act (HIPAA) in connection with the good faith provision of telehealth during the COVID-19 national public health emergency. (AMA)

## 2018-12-03 ENCOUNTER — Encounter: Payer: BC Managed Care – PPO | Admitting: Student in an Organized Health Care Education/Training Program

## 2019-01-11 ENCOUNTER — Encounter: Payer: Self-pay | Admitting: Student in an Organized Health Care Education/Training Program

## 2019-01-12 ENCOUNTER — Encounter: Payer: Self-pay | Admitting: Student in an Organized Health Care Education/Training Program

## 2019-01-12 ENCOUNTER — Other Ambulatory Visit: Payer: Self-pay

## 2019-01-12 ENCOUNTER — Ambulatory Visit
Payer: BC Managed Care – PPO | Attending: Student in an Organized Health Care Education/Training Program | Admitting: Student in an Organized Health Care Education/Training Program

## 2019-01-12 DIAGNOSIS — M47816 Spondylosis without myelopathy or radiculopathy, lumbar region: Secondary | ICD-10-CM

## 2019-01-12 DIAGNOSIS — G894 Chronic pain syndrome: Secondary | ICD-10-CM | POA: Diagnosis not present

## 2019-01-12 DIAGNOSIS — Z9889 Other specified postprocedural states: Secondary | ICD-10-CM

## 2019-01-12 DIAGNOSIS — M961 Postlaminectomy syndrome, not elsewhere classified: Secondary | ICD-10-CM

## 2019-01-12 MED ORDER — CELECOXIB 100 MG PO CAPS: ORAL_CAPSULE | ORAL | 0 refills | Status: DC

## 2019-01-12 MED ORDER — DULOXETINE HCL 30 MG PO CPEP: ORAL_CAPSULE | ORAL | 0 refills | Status: DC

## 2019-01-12 NOTE — Progress Notes (Signed)
Pain Management Virtual Encounter Note - Virtual Visit via Video Conference Telehealth (real-time audio visits between healthcare provider and patient).   Patient's Phone No. & Preferred Pharmacy:  [phone] (home); [phone] (mobile); (Preferred) [phone] [email or web address]  [facility] [phone] [facility] Humphre[facility]- [place], [place] - [address] [place] [id number] Phone: [phone] Fax: [phone]  [facility] [place] - [address] [place] [id number] Phone: [phone] Fax: [phone]    Pre-screening note:  Our staff contacted [name] and offered him an "in person", "face-to-face" appointment versus a telephone encounter. He indicated preferring the telephone encounter, at this time.   Reason for Virtual Visit: COVID-19*  Social distancing based on CDC and AMA recommendations.   I contacted Standley BrookingJohn H Sandy on 01/12/2019 via video conference.      I clearly identified myself as Edward JollyBilal Niel Peretti, MD. I verified that I was speaking with the correct person using two identifiers (Name: Standley BrookingJohn H Hullum, and date of birth: Jan 28, 1966).  Advanced Informed Consent I sought verbal advanced consent from Standley BrookingJohn H Loveall for virtual visit interactions. I informed [name] of possible security and privacy concerns, risks, and limitations associated with providing "not-in-person" medical evaluation and management services. I also informed [name] of the availability of "in-person" appointments. Finally, I informed him that there would be a charge for the virtual visit and that he could be  personally, fully or partially, financially responsible for it. [name] expressed understanding and agreed to proceed.   Historic Elements   Mr. Standley BrookingJohn H Hosack is a 53 y.o. year old, male patient evaluated today after his last encounter by our practice on 11/19/2018. [name]  has a past medical history of Bulging lumbar  disc. He also  has a past surgical history that includes Lumbar laminectomy/decompression microdiscectomy (Right, 05/28/2017). [name] has a current medication list which includes the following prescription(s): methocarbamol, multiple vitamin, tadalafil, celecoxib, and duloxetine. He  reports that he has been smoking cigars. He has quit using smokeless tobacco.  His smokeless tobacco use included snuff. He reports current alcohol use of about 2.0 standard drinks of alcohol per week. He reports that he does not use drugs. [name] has No Known Allergies.   HPI  Today, he is being contacted for worsening of previously known (established) problem  Patient continues to endorse low back pain and occasional hip pain.  It is worse on the right side.  He has been working with physical therapy and also completed dry needling which she states was not very helpful.  He is not going to work.  He is not interested in spinal cord stimulation.  He also has reservations about any sort of injection therapies that I have offered him including epidural injections and/or facet blocks.  We will be focusing primarily on nonopioid-based pain management.  Patient has tried and failed gabapentin and Lyrica in the past.  He states that neither of these medications were effective.  He was previously prescribed Lyrica 50 mg twice daily by me which she did not find any benefit with.  Patient continues to exercise and workout on some mornings which I commended him on.   UDS:  Summary  Date Value Ref Range Status  10/08/2018 Note  Final    Comment:    ==================================================================== Compliance Drug Analysis, Ur ==================================================================== Test  Result       Flag       Units Drug Present and Declared for Prescription Verification   Methocarbamol                  PRESENT      EXPECTED Drug Present not Declared for  Prescription Verification   Diphenhydramine                PRESENT      UNEXPECTED Drug Absent but Declared for Prescription Verification   Gabapentin                     Not Detected UNEXPECTED   Trazodone                      Not Detected UNEXPECTED   Naproxen                       Not Detected UNEXPECTED ==================================================================== Test                      Result    Flag   Units      Ref Range   Creatinine              321              mg/dL      >=20 ==================================================================== Declared Medications:  The flagging and interpretation on this report are based on the  following declared medications.  Unexpected results may arise from  inaccuracies in the declared medications.  **Note: The testing scope of this panel includes these medications:  Gabapentin  Methocarbamol  Naproxen  Trazodone  **Note: The testing scope of this panel does not include the  following reported medications:  Multivitamin  Tadalafil ==================================================================== For clinical consultation, please call [phone]. ====================================================================    Laboratory Chemistry Profile (12 mo)  Renal: No results found for requested labs within last 8760 hours.  Lab Results  Component Value Date   GFRAA >60 05/19/2017   GFRNONAA >60 05/19/2017   Hepatic: No results found for requested labs within last 8760 hours. No results found for: AST, ALT Other: No results found for requested labs within last 8760 hours. Note: Above Lab results reviewed.  Imaging  MR LUMBAR SPINE WO CONTRAST CLINICAL DATA:  Chronic bilateral low back pain with sciatica, sciatica laterality unspecified.  EXAM: MRI LUMBAR SPINE WITHOUT CONTRAST  TECHNIQUE: Multiplanar, multisequence MR imaging of the lumbar spine was performed. No intravenous contrast was  administered.  COMPARISON:  Lumbar spine MRI 02/28/2017  FINDINGS: Please note the examination was ordered without and with contrast. However, the patient refused contrast at the time of examination.  Segmentation: For the purposes of this dictation, five lumbar vertebrae are assumed and the caudal most well-formed intervertebral disc is designated L5-S1.  Alignment: Straightening of the expected lumbar lordosis. Trace L4-L5 retrolisthesis. Mild L5-S1 grade 1 retrolisthesis.  Vertebrae: Vertebral body height is maintained. No suspicious osseous lesions. Degenerative endplate irregularity and mild mixed degenerative endplate marrow signal at L4-L5 and L5-S1, including degenerative endplate edema.  Conus medullaris and cauda equina: Conus extends to the T12-L1 level. Conus and cauda equina appear normal.  Paraspinal and other soft tissues: Imaged paraspinal soft tissues unremarkable. Included portions of the abdomen unremarkable.  Disc levels:  Moderate L4-L5 disc degeneration. Severe L5-S1 disc degeneration. Mild disc degeneration at the remaining levels.  Unless otherwise stated, the level by  level findings below have not significantly changed since prior MRI 02/28/2017.  No significant posterior disc herniation, spinal canal stenosis or neural foraminal narrowing at the T12-L1 through L2-L3 levels  L3-L4: Small disc bulge. No significant spinal canal stenosis or neural foraminal narrowing.  L4-L5: Sequela of interval discectomy. A previously demonstrated moderate to large right center disc extrusion has markedly decreased in size. Disc bulge with endplate spurring. Mild facet arthrosis. There is a persistent small focus of signal abnormality within the right subarticular zone with contact upon the descending right L5 nerve root, which likely reflects a small residual/recurrent disc extrusion. Marked interval decrease in previously demonstrated right subarticular  stenosis. No significant central canal stenosis remains. Mild bilateral neural foraminal narrowing.  L5-S1: Circumferential disc osteophyte complex with superimposed broad-based central disc protrusion mild facet arthrosis. The disc bulge/protrusion contributes to mild bilateral subarticular narrowing with possible contact upon the bilateral descending S1 nerve roots. No significant central canal stenosis. Mild bilateral foraminal stenosis.  IMPRESSION: At L4-L5, sequela of interval discectomy. Persistent small focus of signal abnormality within the right subarticular zone with contact upon the descending right L5 nerve root, likely reflecting a small residual/recurrent disc extrusion. No significant central canal stenosis remains. Mild bilateral neural foraminal narrowing.  Unchanged L5-S1 spondylosis, which include severe disc height loss, circumferential disc osteophyte complex, broad-based central disc protrusion and mild facet arthrosis. Mild bilateral subarticular stenosis and neural foraminal narrowing.  Electronically Signed   By: Jackey Loge   On: 10/30/2018 10:47   Assessment  The primary encounter diagnosis was Lumbar post-laminectomy syndrome. Diagnoses of Failed back surgical syndrome, S/P lumbar microdiscectomy, Lumbar spondylosis, and Chronic pain syndrome were also pertinent to this visit.  Plan of Care  Problem-specific:  Chronic pain syndrome Stop Aleve, Lyrica. Start cymbalta and celebrex as below  Requested Prescriptions   Signed Prescriptions Disp Refills  . DULoxetine (CYMBALTA) 30 MG capsule 120 capsule 0    Sig: Take 1 capsule (30 mg total) by mouth daily for 30 days, THEN 2 capsules (60 mg total) daily.  . celecoxib (CELEBREX) 100 MG capsule 90 capsule 0    Sig: Take 1 capsule (100 mg total) by mouth 2 (two) times daily for 30 days, THEN 1 capsule (100 mg total) 2 (two) times daily as needed for mild pain.    Failed back surgical syndrome Patient  not interested in interventional pain therapies such as facet blocks, epidural, or SCS  I have discontinued Rondle H. Heyde's naproxen sodium and pregabalin. I am also having him start on DULoxetine and celecoxib. Additionally, I am having him maintain his Multiple Vitamins-Minerals (MULTIVITAMIN PO), tadalafil, and methocarbamol.  Pharmacotherapy (Medications Ordered): Meds ordered this encounter  Medications  . DULoxetine (CYMBALTA) 30 MG capsule    Sig: Take 1 capsule (30 mg total) by mouth daily for 30 days, THEN 2 capsules (60 mg total) daily.    Dispense:  120 capsule    Refill:  0  . celecoxib (CELEBREX) 100 MG capsule    Sig: Take 1 capsule (100 mg total) by mouth 2 (two) times daily for 30 days, THEN 1 capsule (100 mg total) 2 (two) times daily as needed for mild pain.    Dispense:  90 capsule    Refill:  0   Follow-up plan:   Return in about 8 weeks (around 03/09/2019) for Medication Management, virtual.     No benefit with gabapentin.  Prescription for Lyrica as above.  Increased dose of Robaxin which was providing him  with benefit.  Not interested in any interventional therapies.    Recent Visits Date Type Provider Dept  11/19/18 Office Visit Edward Jolly, MD [facility]  Showing recent visits within past 90 days and meeting all other requirements   Today's Visits Date Type Provider Dept  01/12/19 Office Visit Edward Jolly, MD [facility]  Showing today's visits and meeting all other requirements   Future Appointments No visits were found meeting these conditions.  Showing future appointments within next 90 days and meeting all other requirements   I discussed the assessment and treatment plan with the patient. The patient was provided an opportunity to ask questions and all were answered. The patient agreed with the plan and demonstrated an understanding of the instructions.  Patient advised to call back or seek an in-person evaluation if the  symptoms or condition worsens.  Total duration of non-face-to-face encounter: .  Note by: Edward Jolly, MD Date: 01/12/2019; Time: 11:24 AM  Note: This dictation was prepared with Dragon dictation. Any transcriptional errors that may result from this process are unintentional.  Disclaimer:  * Given the special circumstances of the COVID-19 pandemic, the federal government has announced that the Office for Civil Rights (OCR) will exercise its enforcement discretion and will not impose penalties on physicians using telehealth in the event of noncompliance with regulatory requirements under the [employer] Portability and Accountability Act (HIPAA) in connection with the good faith provision of telehealth during the COVID-19 national public health emergency. (AMA)

## 2019-01-12 NOTE — Assessment & Plan Note (Signed)
Stop Aleve, Lyrica. Start cymbalta and celebrex as below  Requested Prescriptions   Signed Prescriptions Disp Refills  . DULoxetine (CYMBALTA) 30 MG capsule 120 capsule 0    Sig: Take 1 capsule (30 mg total) by mouth daily for 30 days, THEN 2 capsules (60 mg total) daily.  . celecoxib (CELEBREX) 100 MG capsule 90 capsule 0    Sig: Take 1 capsule (100 mg total) by mouth 2 (two) times daily for 30 days, THEN 1 capsule (100 mg total) 2 (two) times daily as needed for mild pain.

## 2019-01-12 NOTE — Assessment & Plan Note (Signed)
Patient not interested in interventional pain therapies such as facet blocks, epidural, or SCS

## 2019-01-26 ENCOUNTER — Other Ambulatory Visit: Payer: Self-pay | Admitting: Student in an Organized Health Care Education/Training Program

## 2019-01-26 DIAGNOSIS — M961 Postlaminectomy syndrome, not elsewhere classified: Secondary | ICD-10-CM

## 2019-01-26 DIAGNOSIS — G894 Chronic pain syndrome: Secondary | ICD-10-CM

## 2019-02-03 ENCOUNTER — Other Ambulatory Visit: Payer: Self-pay | Admitting: Student in an Organized Health Care Education/Training Program

## 2019-02-03 DIAGNOSIS — G894 Chronic pain syndrome: Secondary | ICD-10-CM

## 2019-02-03 DIAGNOSIS — M961 Postlaminectomy syndrome, not elsewhere classified: Secondary | ICD-10-CM

## 2019-02-13 ENCOUNTER — Other Ambulatory Visit: Payer: Self-pay | Admitting: Student in an Organized Health Care Education/Training Program

## 2019-03-07 ENCOUNTER — Other Ambulatory Visit: Payer: Self-pay | Admitting: Family Medicine

## 2019-03-07 DIAGNOSIS — N529 Male erectile dysfunction, unspecified: Secondary | ICD-10-CM

## 2019-03-08 ENCOUNTER — Encounter: Payer: Self-pay | Admitting: Student in an Organized Health Care Education/Training Program

## 2019-03-09 ENCOUNTER — Ambulatory Visit
Payer: BC Managed Care – PPO | Attending: Student in an Organized Health Care Education/Training Program | Admitting: Student in an Organized Health Care Education/Training Program

## 2019-03-09 ENCOUNTER — Other Ambulatory Visit: Payer: Self-pay

## 2019-03-09 DIAGNOSIS — M961 Postlaminectomy syndrome, not elsewhere classified: Secondary | ICD-10-CM | POA: Diagnosis not present

## 2019-03-09 DIAGNOSIS — G894 Chronic pain syndrome: Secondary | ICD-10-CM | POA: Diagnosis not present

## 2019-03-09 DIAGNOSIS — M47816 Spondylosis without myelopathy or radiculopathy, lumbar region: Secondary | ICD-10-CM | POA: Diagnosis not present

## 2019-03-09 DIAGNOSIS — Z9889 Other specified postprocedural states: Secondary | ICD-10-CM

## 2019-03-09 DIAGNOSIS — M7918 Myalgia, other site: Secondary | ICD-10-CM

## 2019-03-09 MED ORDER — METHOCARBAMOL 750 MG PO TABS: 1125.0000 mg | ORAL_TABLET | Freq: Three times a day (TID) | ORAL | 5 refills | Status: DC | PRN

## 2019-03-09 MED ORDER — DULOXETINE HCL 60 MG PO CPEP: 60.0000 mg | ORAL_CAPSULE | Freq: Every day | ORAL | 5 refills | Status: DC

## 2019-03-09 NOTE — Progress Notes (Signed)
Patient: Maxwell [name]  Service Category: E/M  Provider: Edward Jolly, MD  DOB: [birth year]  DOS: 03/09/2019  Location: Office  MRN: [id number]  Setting: Ambulatory outpatient  Referring Provider: Duanne Limerick, MD  Type: Established Patient  Specialty: Interventional Pain Management  PCP: Maxwell Limerick, MD  Location: Home  Delivery: TeleHealth     Virtual Encounter - Pain Management PROVIDER NOTE: Information contained herein reflects review and annotations entered in association with encounter. Interpretation of such information and data should be left to medically-trained personnel. Information provided to patient can be located elsewhere in the medical record under "Patient Instructions". Document created using STT-dictation technology, any transcriptional errors that may result from process are unintentional.    Contact & Pharmacy Preferred: [phone] Home: [phone] (home) Mobile: [phone] (mobile) E-mail: [email or web address]  [facility] [phone] Dan Humphreys, [place] - [address] [place] [address] Phone: [phone] Fax: [phone]  [facility] - [place], [place] - [address] [place] [address] Phone: [phone] Fax: [phone]   Pre-screening  [name] offered "in-person" vs "virtual" encounter. He indicated preferring virtual for this encounter.   Reason COVID-19*  Social distancing based on CDC and AMA recommendations.   I contacted Maxwell [name] on 03/09/2019 via telephone.      I clearly identified myself as Edward Jolly, MD. I verified that I was speaking with the correct person using two identifiers (Name: Maxwell [name], and date of birth: 04-16-1965).  This visit was completed via telephone due to the restrictions of the COVID-19 pandemic. All issues as above were discussed and addressed but no physical exam was performed. If it was felt that the patient should be evaluated in  the office, they were directed there. The patient verbally consented to this visit. Patient was unable to complete an audio/visual visit due to Technical difficulties and/or Lack of internet. Due to the catastrophic nature of the COVID-19 pandemic, this visit was done through audio contact only.  Location of the patient: home address (see Epic for details)  Location of the provider: office  Consent I sought verbal advanced consent from Maxwell [name] for virtual visit interactions. I informed [name] of possible security and privacy concerns, risks, and limitations associated with providing "not-in-person" medical evaluation and management services. I also informed [name] of the availability of "in-person" appointments. Finally, I informed him that there would be a charge for the virtual visit and that he could be  personally, fully or partially, financially responsible for it. [name] expressed understanding and agreed to proceed.   Historic Elements   Maxwell [name] is a 54 y.o. year old, male patient evaluated today after his last encounter by our practice on 02/13/2019. Maxwell [name]  has a past medical history of Bulging lumbar disc. He also  has a past surgical history that includes Lumbar laminectomy/decompression microdiscectomy (Right, 05/28/2017). Maxwell [name] has a current medication list which includes the following prescription(s): methocarbamol, multiple vitamin, tadalafil, and duloxetine. He  reports that he has been smoking cigars. He has quit using smokeless tobacco.  His smokeless tobacco use included snuff. He reports current alcohol use of about 2.0 standard drinks of alcohol per week. He reports that he does not use drugs. Maxwell [name] has No Known Allergies.   HPI  Today, he is being contacted for medication management.   No change in medical history since last visit.  Patient's pain  is at baseline.  Patient continues multimodal pain regimen as prescribed.   States that it provides pain relief and improvement in functional status. On Cymbalta 60 mg, he does find this beneficial. Would like to continue. Not much benefit with Celebrex Does take Robaxin prn which helps with his lumbar paraspinal muscle spasms Not interested in SCS (see previous notes) Non opioid based chronic pain management  UDS:  Summary  Date Value Ref Range Status  10/08/2018 Note  Final    Comment:    ==================================================================== Compliance Drug Analysis, Ur ==================================================================== Test                             Result       Flag       Units Drug Present and Declared for Prescription Verification   Methocarbamol                  PRESENT      EXPECTED Drug Present not Declared for Prescription Verification   Diphenhydramine                PRESENT      UNEXPECTED Drug Absent but Declared for Prescription Verification   Gabapentin                     Not Detected UNEXPECTED   Trazodone                      Not Detected UNEXPECTED   Naproxen                       Not Detected UNEXPECTED ==================================================================== Test                      Result    Flag   Units      Ref Range   Creatinine              321              mg/dL      >=16 ==================================================================== Declared Medications:  The flagging and interpretation on this report are based on the  following declared medications.  Unexpected results may arise from  inaccuracies in the declared medications.  **Note: The testing scope of this panel includes these medications:  Gabapentin  Methocarbamol  Naproxen  Trazodone  **Note: The testing scope of this panel does not include the  following reported medications:  Multivitamin  Tadalafil ==================================================================== For clinical consultation, please call  [phone]. ====================================================================    Laboratory Chemistry Profile (12 mo)  Renal: No results found for requested labs within last 8760 hours.  Lab Results  Component Value Date   GFRAA >60 05/19/2017   GFRNONAA >60 05/19/2017   Hepatic: No results found for requested labs within last 8760 hours. No results found for: AST, ALT Other: No results found for requested labs within last 8760 hours.  Note: Above Lab results reviewed.  Imaging  MR LUMBAR SPINE WO CONTRAST CLINICAL DATA:  Chronic bilateral low back pain with sciatica, sciatica laterality unspecified.  EXAM: MRI LUMBAR SPINE WITHOUT CONTRAST  TECHNIQUE: Multiplanar, multisequence MR imaging of the lumbar spine was performed. No intravenous contrast was administered.  COMPARISON:  Lumbar spine MRI 02/28/2017  FINDINGS: Please note the examination was ordered without and with contrast. However, the patient refused contrast at the time of examination.  Segmentation: For the purposes  of this dictation, five lumbar vertebrae are assumed and the caudal most well-formed intervertebral disc is designated L5-S1.  Alignment: Straightening of the expected lumbar lordosis. Trace L4-L5 retrolisthesis. Mild L5-S1 grade 1 retrolisthesis.  Vertebrae: Vertebral body height is maintained. No suspicious osseous lesions. Degenerative endplate irregularity and mild mixed degenerative endplate marrow signal at L4-L5 and L5-S1, including degenerative endplate edema.  Conus medullaris and cauda equina: Conus extends to the T12-L1 level. Conus and cauda equina appear normal.  Paraspinal and other soft tissues: Imaged paraspinal soft tissues unremarkable. Included portions of the abdomen unremarkable.  Disc levels:  Moderate L4-L5 disc degeneration. Severe L5-S1 disc degeneration. Mild disc degeneration at the remaining levels.  Unless otherwise stated, the level by level  findings below have not significantly changed since prior MRI 02/28/2017.  No significant posterior disc herniation, spinal canal stenosis or neural foraminal narrowing at the T12-L1 through L2-L3 levels  L3-L4: Small disc bulge. No significant spinal canal stenosis or neural foraminal narrowing.  L4-L5: Sequela of interval discectomy. A previously demonstrated moderate to large right center disc extrusion has markedly decreased in size. Disc bulge with endplate spurring. Mild facet arthrosis. There is a persistent small focus of signal abnormality within the right subarticular zone with contact upon the descending right L5 nerve root, which likely reflects a small residual/recurrent disc extrusion. Marked interval decrease in previously demonstrated right subarticular stenosis. No significant central canal stenosis remains. Mild bilateral neural foraminal narrowing.  L5-S1: Circumferential disc osteophyte complex with superimposed broad-based central disc protrusion mild facet arthrosis. The disc bulge/protrusion contributes to mild bilateral subarticular narrowing with possible contact upon the bilateral descending S1 nerve roots. No significant central canal stenosis. Mild bilateral foraminal stenosis.  IMPRESSION: At L4-L5, sequela of interval discectomy. Persistent small focus of signal abnormality within the right subarticular zone with contact upon the descending right L5 nerve root, likely reflecting a small residual/recurrent disc extrusion. No significant central canal stenosis remains. Mild bilateral neural foraminal narrowing.  Unchanged L5-S1 spondylosis, which include severe disc height loss, circumferential disc osteophyte complex, broad-based central disc protrusion and mild facet arthrosis. Mild bilateral subarticular stenosis and neural foraminal narrowing.  Electronically Signed   By: Kellie Simmering   On: 10/30/2018 10:47   Assessment  The primary  encounter diagnosis was Lumbar post-laminectomy syndrome. Diagnoses of Failed back surgical syndrome, S/P lumbar microdiscectomy, Lumbar spondylosis, Chronic pain syndrome, and Musculoskeletal pain were also pertinent to this visit.  Plan of Care  I have discontinued Axavier H. Manchester's DULoxetine and celecoxib. I am also having him start on DULoxetine. Additionally, I am having him maintain his Multiple Vitamins-Minerals (MULTIVITAMIN PO), tadalafil, and methocarbamol.  Pharmacotherapy (Medications Ordered): Meds ordered this encounter  Medications  . DULoxetine (CYMBALTA) 60 MG capsule    Sig: Take 1 capsule (60 mg total) by mouth daily.    Dispense:  30 capsule    Refill:  5  . methocarbamol (ROBAXIN) 750 MG tablet    Sig: Take 1.5 tablets (1,125 mg total) by mouth every 8 (eight) hours as needed for muscle spasms.    Dispense:  90 tablet    Refill:  5    Do not place this medication, or any other prescription from our practice, on "Automatic Refill". Patient may have prescription filled one day early if pharmacy is closed on scheduled refill date.   Follow-up plan:   Return in about 6 months (around 09/06/2019).     No benefit with gabapentin.  Prescription for Lyrica as above.  Increased dose of Robaxin which was providing him with benefit along with Cymbalta.  Not interested in any interventional therapies.     Recent Visits Date Type Provider Dept  01/12/19 Office Visit Edward Jolly, MD [facility]  Showing recent visits within past 90 days and meeting all other requirements   Today's Visits Date Type Provider Dept  03/09/19 Office Visit Edward Jolly, MD [facility]  Showing today's visits and meeting all other requirements   Future Appointments No visits were found meeting these conditions.  Showing future appointments within next 90 days and meeting all other requirements   I discussed the assessment and treatment plan with the patient. The patient was  provided an opportunity to ask questions and all were answered. The patient agreed with the plan and demonstrated an understanding of the instructions.  Patient advised to call back or seek an in-person evaluation if the symptoms or condition worsens.  Duration of encounter: .  Note by: Edward Jolly, MD Date: 03/09/2019; Time: 3:06 PM

## 2019-04-01 ENCOUNTER — Other Ambulatory Visit: Payer: Self-pay | Admitting: Student in an Organized Health Care Education/Training Program

## 2019-04-01 DIAGNOSIS — M961 Postlaminectomy syndrome, not elsewhere classified: Secondary | ICD-10-CM

## 2019-04-01 DIAGNOSIS — G894 Chronic pain syndrome: Secondary | ICD-10-CM

## 2019-05-24 DIAGNOSIS — Z03818 Encounter for observation for suspected exposure to other biological agents ruled out: Secondary | ICD-10-CM | POA: Diagnosis not present

## 2019-05-25 DIAGNOSIS — Z20822 Contact with and (suspected) exposure to covid-19: Secondary | ICD-10-CM | POA: Diagnosis not present

## 2019-06-07 DIAGNOSIS — Z20828 Contact with and (suspected) exposure to other viral communicable diseases: Secondary | ICD-10-CM | POA: Diagnosis not present

## 2019-06-07 DIAGNOSIS — Z03818 Encounter for observation for suspected exposure to other biological agents ruled out: Secondary | ICD-10-CM | POA: Diagnosis not present

## 2019-06-24 DIAGNOSIS — G8929 Other chronic pain: Secondary | ICD-10-CM | POA: Diagnosis not present

## 2019-06-24 DIAGNOSIS — M545 Low back pain: Secondary | ICD-10-CM | POA: Diagnosis not present

## 2019-07-23 DIAGNOSIS — G8929 Other chronic pain: Secondary | ICD-10-CM | POA: Diagnosis not present

## 2019-07-23 DIAGNOSIS — M545 Low back pain: Secondary | ICD-10-CM | POA: Diagnosis not present

## 2019-07-29 DIAGNOSIS — M961 Postlaminectomy syndrome, not elsewhere classified: Secondary | ICD-10-CM | POA: Diagnosis not present

## 2019-08-03 DIAGNOSIS — G894 Chronic pain syndrome: Secondary | ICD-10-CM | POA: Diagnosis not present

## 2019-08-05 ENCOUNTER — Other Ambulatory Visit: Payer: Self-pay | Admitting: Neurosurgery

## 2019-08-05 DIAGNOSIS — G894 Chronic pain syndrome: Secondary | ICD-10-CM

## 2019-08-09 DIAGNOSIS — F0634 Mood disorder due to known physiological condition with mixed features: Secondary | ICD-10-CM | POA: Diagnosis not present

## 2019-08-10 DIAGNOSIS — R419 Unspecified symptoms and signs involving cognitive functions and awareness: Secondary | ICD-10-CM | POA: Diagnosis not present

## 2019-08-11 DIAGNOSIS — R419 Unspecified symptoms and signs involving cognitive functions and awareness: Secondary | ICD-10-CM | POA: Diagnosis not present

## 2019-08-12 DIAGNOSIS — R419 Unspecified symptoms and signs involving cognitive functions and awareness: Secondary | ICD-10-CM | POA: Diagnosis not present

## 2019-08-13 DIAGNOSIS — R419 Unspecified symptoms and signs involving cognitive functions and awareness: Secondary | ICD-10-CM | POA: Diagnosis not present

## 2019-08-20 ENCOUNTER — Other Ambulatory Visit: Payer: Self-pay

## 2019-08-20 ENCOUNTER — Ambulatory Visit
Admission: RE | Admit: 2019-08-20 | Discharge: 2019-08-20 | Disposition: A | Discharge status: Home / Self Care | Payer: BC Managed Care – PPO | Source: Ambulatory Visit | Attending: Neurosurgery | Admitting: Neurosurgery

## 2019-08-20 ENCOUNTER — Other Ambulatory Visit: Payer: Self-pay | Admitting: Neurosurgery

## 2019-08-20 DIAGNOSIS — G8929 Other chronic pain: Secondary | ICD-10-CM | POA: Diagnosis not present

## 2019-08-20 DIAGNOSIS — Z01818 Encounter for other preprocedural examination: Secondary | ICD-10-CM | POA: Diagnosis not present

## 2019-08-20 DIAGNOSIS — G894 Chronic pain syndrome: Secondary | ICD-10-CM

## 2019-08-20 DIAGNOSIS — M5124 Other intervertebral disc displacement, thoracic region: Secondary | ICD-10-CM | POA: Diagnosis not present

## 2019-08-30 ENCOUNTER — Encounter
Admission: RE | Admit: 2019-08-30 | Discharge: 2019-08-30 | Disposition: A | Discharge status: Home / Self Care | Payer: BC Managed Care – PPO | Source: Ambulatory Visit | Attending: Neurosurgery | Admitting: Neurosurgery

## 2019-08-30 ENCOUNTER — Other Ambulatory Visit: Payer: Self-pay

## 2019-08-30 DIAGNOSIS — Z01812 Encounter for preprocedural laboratory examination: Secondary | ICD-10-CM | POA: Diagnosis not present

## 2019-08-30 HISTORY — DX: Other intervertebral disc degeneration, lumbar region: M51.36

## 2019-08-30 HISTORY — DX: Personal history of urinary calculi: Z87.442

## 2019-08-30 HISTORY — DX: Other intervertebral disc degeneration, lumbar region without mention of lumbar back pain or lower extremity pain: M51.369

## 2019-08-30 HISTORY — DX: Other chronic pain: G89.29

## 2019-08-30 LAB — CBC
HCT: 44.4 % (ref 39.0–52.0)
Hemoglobin: 14.8 g/dL (ref 13.0–17.0)
MCH: 31.6 pg (ref 26.0–34.0)
MCHC: 33.3 g/dL (ref 30.0–36.0)
MCV: 94.9 fL (ref 80.0–100.0)
Platelets: 329 10*3/uL (ref 150–400)
RBC: 4.68 MIL/uL (ref 4.22–5.81)
RDW: 13.2 % (ref 11.5–15.5)
WBC: 10.5 10*3/uL (ref 4.0–10.5)
nRBC: 0 % (ref 0.0–0.2)

## 2019-08-30 LAB — BASIC METABOLIC PANEL
Anion gap: 6 (ref 5–15)
BUN: 15 mg/dL (ref 6–20)
CO2: 27 mmol/L (ref 22–32)
Calcium: 9.1 mg/dL (ref 8.9–10.3)
Chloride: 104 mmol/L (ref 98–111)
Creatinine, Ser: 1.37 mg/dL — ABNORMAL HIGH (ref 0.61–1.24)
GFR calc Af Amer: >60 mL/min (ref >60)
GFR calc non Af Amer: 58 mL/min — ABNORMAL LOW (ref >60)
Glucose, Bld: 118 mg/dL — ABNORMAL HIGH (ref 70–99)
Potassium: 3.5 mmol/L (ref 3.5–5.1)
Sodium: 137 mmol/L (ref 135–145)

## 2019-08-30 LAB — PROTIME-INR
INR: 1 (ref 0.8–1.2)
Prothrombin Time: 13.2 seconds (ref 11.4–15.2)

## 2019-08-30 LAB — URINALYSIS, ROUTINE W REFLEX MICROSCOPIC
Bacteria, UA: NONE SEEN
Bilirubin Urine: NEGATIVE
Glucose, UA: NEGATIVE mg/dL
Ketones, ur: NEGATIVE mg/dL
Leukocytes,Ua: NEGATIVE
Nitrite: NEGATIVE
Protein, ur: NEGATIVE mg/dL
Specific Gravity, Urine: 1.024 (ref 1.005–1.030)
Squamous Epithelial / HPF: NONE SEEN (ref 0–5)
pH: 5 (ref 5.0–8.0)

## 2019-08-30 LAB — TYPE AND SCREEN
ABO/RH(D): A NEG
Antibody Screen: NEGATIVE

## 2019-08-30 LAB — APTT: aPTT: 30 seconds (ref 24–36)

## 2019-08-30 LAB — SURGICAL PCR SCREEN
MRSA, PCR: NEGATIVE
Staphylococcus aureus: NEGATIVE

## 2019-08-30 NOTE — Patient Instructions (Signed)
Your procedure is scheduled on: Mon. 7/26 Report to Day Surgery. Medical Cleotis Lema To find out your arrival time please call [phone] between 1PM - 3PM on Friday 7/23.  Remember: Instructions that are not followed completely may result in serious medical risk,  up to and including death, or upon the discretion of your surgeon and anesthesiologist your  surgery may need to be rescheduled.     _X__ 1. Do not eat food after midnight the night before your procedure.                 No gum chewing or hard candies. You may drink clear liquids up to 2 hours                 before you are scheduled to arrive for your surgery- DO not drink clear                 liquids within 2 hours of the start of your surgery.                 Clear Liquids include:  water, apple juice without pulp, clear Gatorade, G2 or                  Gatorade Zero (avoid Red/Purple/Blue), Black Coffee or Tea (Do not add                 anything to coffee or tea). _____2.   Complete the carbohydrate drink provided to you, 2 hours before arrival.  __X__2.  On the morning of surgery brush your teeth with toothpaste and water, you                may rinse your mouth with mouthwash if you wish.  Do not swallow any toothpaste of mouthwash.     _X__ 3.  No Alcohol for 24 hours before or after surgery.   _X__ 4.  Do Not Smoke or use e-cigarettes For 24 Hours Prior to Your Surgery.                 Do not use any chewable tobacco products for at least 6 hours prior to                 Surgery.  ___  5.  Do not use any recreational drugs (marijuana, cocaine, heroin, ecstacy, MDMA or other)                For at least one week prior to your surgery.  Combination of these drugs with anesthesia                May have life threatening results.  ____  6.  Bring all medications with you on the day of surgery if instructed.   __x__  7.  Notify your doctor if there is any change in your medical condition       (cold, fever, infections).     Do not wear jewelry, Do not wear lotions,  You may wear deodorant. Do not shave 48 hours prior to surgery. Men may shave face and neck. Do not bring valuables to the hospital.    [facility] is not responsible for any belongings or valuables.  Contacts, dentures or bridgework may not be worn into surgery. Leave your suitcase in the car. After surgery it may be brought to your room. For patients admitted to the hospital, discharge time is determined by your treatment team.   Patients discharged the  day of surgery will not be allowed to drive home.   Make arrangements for someone to be with you for the first 24 hours of your Same Day Discharge.    Please read over the following fact sheets that you were given:    __x__ Take these medicines the morning of surgery with A SIP OF WATER:    1. celecoxib (CELEBREX) 100 MG capsule  2. methocarbamol (ROBAXIN) 750 MG tablet if needed  3.   4.  5.  6.  ____ Fleet Enema (as directed)   __x__ Use CHG Soap (or wipes) as directed  ____ Use Benzoyl Peroxide Gel as instructed  ____ Use inhalers on the day of surgery  ____ Stop metformin 2 days prior to surgery    ____ Take 1/2 of usual insulin dose the night before surgery. No insulin the morning          of surgery.   ____ Stop Coumadin/Plavix/aspirin on   __x__ Stop Anti-inflammatories today    no ibuprofen aleve or aspirin.  May take tylenol   __x__ Stop supplements until after surgery.  No Vitamin C E or fish oil or herbal supplements.  ____ Bring C-Pap to the hospital.

## 2019-09-01 ENCOUNTER — Ambulatory Visit (HOSPITAL_BASED_OUTPATIENT_CLINIC_OR_DEPARTMENT_OTHER): Payer: BC Managed Care – PPO | Admitting: Student in an Organized Health Care Education/Training Program

## 2019-09-01 ENCOUNTER — Telehealth: Payer: Self-pay

## 2019-09-01 ENCOUNTER — Encounter: Payer: Self-pay | Admitting: Student in an Organized Health Care Education/Training Program

## 2019-09-01 ENCOUNTER — Other Ambulatory Visit: Payer: Self-pay

## 2019-09-01 ENCOUNTER — Other Ambulatory Visit
Admission: RE | Admit: 2019-09-01 | Discharge: 2019-09-01 | Disposition: A | Discharge status: Home / Self Care | Payer: BC Managed Care – PPO | Source: Ambulatory Visit | Attending: Neurosurgery | Admitting: Neurosurgery

## 2019-09-01 DIAGNOSIS — E119 Type 2 diabetes mellitus without complications: Secondary | ICD-10-CM | POA: Diagnosis not present

## 2019-09-01 DIAGNOSIS — M47894 Other spondylosis, thoracic region: Secondary | ICD-10-CM | POA: Diagnosis not present

## 2019-09-01 DIAGNOSIS — Z79899 Other long term (current) drug therapy: Secondary | ICD-10-CM | POA: Insufficient documentation

## 2019-09-01 DIAGNOSIS — M62830 Muscle spasm of back: Secondary | ICD-10-CM | POA: Diagnosis not present

## 2019-09-01 DIAGNOSIS — M961 Postlaminectomy syndrome, not elsewhere classified: Secondary | ICD-10-CM | POA: Insufficient documentation

## 2019-09-01 DIAGNOSIS — F17290 Nicotine dependence, other tobacco product, uncomplicated: Secondary | ICD-10-CM | POA: Insufficient documentation

## 2019-09-01 DIAGNOSIS — G894 Chronic pain syndrome: Secondary | ICD-10-CM | POA: Diagnosis not present

## 2019-09-01 DIAGNOSIS — M5136 Other intervertebral disc degeneration, lumbar region: Secondary | ICD-10-CM | POA: Diagnosis not present

## 2019-09-01 DIAGNOSIS — Z20822 Contact with and (suspected) exposure to covid-19: Secondary | ICD-10-CM | POA: Insufficient documentation

## 2019-09-01 MED ORDER — METHOCARBAMOL 750 MG PO TABS: 1125.0000 mg | ORAL_TABLET | Freq: Three times a day (TID) | ORAL | 5 refills | Status: DC | PRN

## 2019-09-01 NOTE — Progress Notes (Signed)
Patient: Maxwell [name]  Service Category: E/M  Provider: Gillis Santa, MD  DOB: 1965/08/08  DOS: 09/01/2019  Location: Office  MRN: [id number]  Setting: Ambulatory outpatient  Referring Provider: Juline Patch, MD  Type: Established Patient  Specialty: Interventional Pain Management  PCP: Juline Patch, MD  Location: Home  Delivery: TeleHealth     Virtual Encounter - Pain Management PROVIDER NOTE: Information contained herein reflects review and annotations entered in association with encounter. Interpretation of such information and data should be left to medically-trained personnel. Information provided to patient can be located elsewhere in the medical record under "Patient Instructions". Document created using STT-dictation technology, any transcriptional errors that may result from process are unintentional.    Contact & Pharmacy Preferred: [phone] Home: [phone] (home) Mobile: [phone] (mobile) E-mail: [email or web address]  [id number] [address]: [phone]: [phone] [facility] - [place]: [phone]: [phone]  Pre-screening  Mr. BRomingeroffered "in-person" vs "virtual" encounter. He indicated preferring virtual for this encounter.   Reason COVID-19*  Social distancing based on CDC and AMA recommendations.   I contacted JCain Sieveon 09/01/2019 via telephone.      I clearly identified myself as BGillis Santa MD. I verified that I was speaking with the correct person using two identifiers (Name: JURI TURNBOUGH and date of birth: 4Dec 07, 1967.  Consent I sought verbal advanced consent from JCain Sievefor virtual visit interactions. I informed Mr. BBriskof possible security and privacy concerns, risks, and limitations associated with providing "not-in-person"  medical evaluation and management services. I also informed Mr. BNikolaiof the availability of "in-person" appointments. Finally, I informed him that there would be a charge for the virtual visit and that he could be  personally, fully or partially, financially responsible for it. Mr. BGalyeanexpressed understanding and agreed to proceed.   Historic Elements   Mr. JJAESHAUN RIVAis a 54y.o. year old, male patient evaluated today after his last contact with our practice on 04/01/2019. Mr. BSunde has a past medical history of Bulging lumbar disc, Chronic pain, DDD (degenerative disc disease), lumbar, and History of kidney stones. He also  has a past surgical history that includes Lumbar laminectomy/decompression microdiscectomy (Right, 05/28/2017). Mr. BMorkenhas a current medication list which includes the following prescription(s): methocarbamol, multiple vitamin, pseudoeph-doxylamine-dm-apap, and tadalafil. He  reports that he has been smoking cigars. He has quit using smokeless tobacco.  His smokeless tobacco use included snuff. He reports current alcohol use of about 2.0 standard drinks of alcohol per week. He reports that he does not use drugs. Mr. BSistrunkhas No Known Allergies.   HPI  Today, he is being contacted for medication management.  Virtual visit today to refill Robaxin which he states does provide him with some pain relief in regards to his lumbar paraspinal muscle spasms.  Patient has seen me on multiple occasions in the past and has expressed that he does not want to pursue a spinal cord stimulator trial however he did meet with Dr. CLacinda Axonwith neurosurgery and is planning for spinal cord stimulator trial under general anesthesia.     Laboratory Chemistry Profile   Renal Lab Results  Component Value Date   BUN 15 08/30/2019   CREATININE 1.37 (H) 08/30/2019   GFRAA >60 08/30/2019  GFRNONAA 58 (L) 08/30/2019     Hepatic No results found for: AST, ALT, ALBUMIN, ALKPHOS,  HCVAB, AMYLASE, LIPASE, AMMONIA   Electrolytes Lab Results  Component Value Date   NA 137 08/30/2019   K 3.5 08/30/2019   CL 104 08/30/2019   CALCIUM 9.1 08/30/2019     Bone No results found for: VD25OH, VD125OH2TOT, NK5397QB3, AL9379KW4, 25OHVITD1, 25OHVITD2, 25OHVITD3, TESTOFREE, TESTOSTERONE   Inflammation (CRP: Acute Phase) (ESR: Chronic Phase) No results found for: CRP, ESRSEDRATE, LATICACIDVEN     Note: Above Lab results reviewed.   Imaging  MR THORACIC SPINE WO CONTRAST CLINICAL DATA:  Chronic pain. Preop planning prior to stimulator placement.  EXAM: MRI THORACIC SPINE WITHOUT CONTRAST  TECHNIQUE: Multiplanar, multisequence MR imaging of the thoracic spine was performed. No intravenous contrast was administered.  COMPARISON:  None.  FINDINGS: Alignment:  Physiologic.  Vertebrae: No fracture, evidence of discitis, or bone lesion.  Cord:  Normal signal and morphology.  Paraspinal and other soft tissues: No acute paraspinal abnormality.  Disc levels:  Disc spaces:  Disc spaces are maintained.  T1-T2: No disc protrusion, foraminal stenosis or central canal stenosis.  T2-T3: No disc protrusion, foraminal stenosis or central canal stenosis.  T3-T4: No disc protrusion, foraminal stenosis or central canal stenosis.  T4-T5: Tiny central disc protrusion. No foraminal or central canal stenosis.  T5-T6: No disc protrusion, foraminal stenosis or central canal stenosis.  T6-T7: Tiny central disc protrusion. No foraminal or central canal stenosis.  T7-T8: Tiny central disc protrusion. No foraminal or central canal stenosis.  T8-T9: Tiny central disc protrusion. No foraminal or central canal stenosis.  T9-T10: No disc protrusion, foraminal stenosis or central canal stenosis.  T10-T11: No disc protrusion, foraminal stenosis or central canal stenosis.  T11-T12: No disc protrusion, foraminal stenosis or central canal stenosis.  IMPRESSION: 1. No  acute osseous injury of the thoracic spine. 2. Mild thoracic spine spondylosis as described above.  Electronically Signed   By: Kathreen Devoid   On: 08/20/2019 13:24  Assessment  Diagnoses of Lumbar post-laminectomy syndrome and Chronic pain syndrome were pertinent to this visit.  Plan of Care  Mr. YANDIEL BERGUM has a current medication list which includes the following long-term medication(s): tadalafil.  Pharmacotherapy (Medications Ordered): Meds ordered this encounter  Medications  . methocarbamol (ROBAXIN) 750 MG tablet    Sig: Take 1.5 tablets (1,125 mg total) by mouth every 8 (eight) hours as needed for muscle spasms.    Dispense:  90 tablet    Refill:  5    Do not place this medication, or any other prescription from our practice, on "Automatic Refill". Patient may have prescription filled one day early if pharmacy is closed on scheduled refill date.   Follow-up plan:   Return in about 6 months (around 03/03/2020) for Medication Management, in person.     No benefit with gabapentin, Lyrica, Cymbalta.  Robaxin is helpful.  Not interested in any interventional therapies or spinal cord stimulator trial however after meeting with Dr. Lacinda Axon on 6/22, he is planning on having a spinal cord stimulator trial under general anesthesia.      Recent Visits No visits were found meeting these conditions. Showing recent visits within past 90 days and meeting all other requirements Today's Visits Date Type Provider Dept  09/01/19 Office Visit Gillis Santa, MD [facility]  Showing today's visits and meeting all other requirements Future Appointments No visits were found meeting these conditions. Showing future appointments within next 90 days and  meeting all other requirements  I discussed the assessment and treatment plan with the patient. The patient was provided an opportunity to ask questions and all were answered. The patient agreed with the plan and demonstrated an  understanding of the instructions.  Patient advised to call back or seek an in-person evaluation if the symptoms or condition worsens.  Duration of encounter: 15 minutes.  Note by: Gillis Santa, MD Date: 09/01/2019; Time: 3:30 PM

## 2019-09-02 ENCOUNTER — Encounter: Payer: BC Managed Care – PPO | Admitting: Student in an Organized Health Care Education/Training Program

## 2019-09-02 ENCOUNTER — Other Ambulatory Visit: Admission: RE | Admit: 2019-09-02 | Payer: BC Managed Care – PPO | Source: Ambulatory Visit

## 2019-09-02 LAB — SARS CORONAVIRUS 2 (TAT 6-24 HRS): SARS Coronavirus 2: NEGATIVE

## 2019-09-06 ENCOUNTER — Ambulatory Visit
Admission: RE | Admit: 2019-09-06 | Discharge: 2019-09-06 | Disposition: A | Discharge status: Home / Self Care | Payer: BC Managed Care – PPO | Attending: Neurosurgery | Admitting: Neurosurgery

## 2019-09-06 ENCOUNTER — Ambulatory Visit: Payer: BC Managed Care – PPO

## 2019-09-06 ENCOUNTER — Ambulatory Visit: Payer: BC Managed Care – PPO | Admitting: Certified Registered"

## 2019-09-06 ENCOUNTER — Encounter
Admission: RE | Disposition: A | Discharge status: Home / Self Care | Payer: Self-pay | Source: Home / Self Care | Attending: Neurosurgery

## 2019-09-06 ENCOUNTER — Encounter: Payer: Self-pay | Admitting: Neurosurgery

## 2019-09-06 DIAGNOSIS — G894 Chronic pain syndrome: Secondary | ICD-10-CM | POA: Diagnosis not present

## 2019-09-06 DIAGNOSIS — Z462 Encounter for fitting and adjustment of other devices related to nervous system and special senses: Secondary | ICD-10-CM | POA: Diagnosis not present

## 2019-09-06 DIAGNOSIS — Z87442 Personal history of urinary calculi: Secondary | ICD-10-CM | POA: Diagnosis not present

## 2019-09-06 DIAGNOSIS — M549 Dorsalgia, unspecified: Secondary | ICD-10-CM | POA: Insufficient documentation

## 2019-09-06 DIAGNOSIS — F17290 Nicotine dependence, other tobacco product, uncomplicated: Secondary | ICD-10-CM | POA: Diagnosis not present

## 2019-09-06 DIAGNOSIS — Z79899 Other long term (current) drug therapy: Secondary | ICD-10-CM | POA: Diagnosis not present

## 2019-09-06 DIAGNOSIS — Z419 Encounter for procedure for purposes other than remedying health state, unspecified: Secondary | ICD-10-CM

## 2019-09-06 HISTORY — PX: SPINAL CORD STIMULATOR TRIAL: SHX5380

## 2019-09-06 SURGERY — LUMBAR SPINAL CORD STIMULATOR TRIAL
Anesthesia: General | Laterality: Bilateral

## 2019-09-06 MED ORDER — LIDOCAINE HCL (PF) 2 % IJ SOLN
INTRAMUSCULAR | Status: AC
  Filled 2019-09-06: qty 5

## 2019-09-06 MED ORDER — CEFAZOLIN SODIUM-DEXTROSE 2-4 GM/100ML-% IV SOLN
2.0000 g | Freq: Once | INTRAVENOUS | Status: AC
  Administered 2019-09-06: 2 g via INTRAVENOUS

## 2019-09-06 MED ORDER — EPHEDRINE 5 MG/ML INJ
INTRAVENOUS | Status: AC
  Filled 2019-09-06: qty 10

## 2019-09-06 MED ORDER — CHLORHEXIDINE GLUCONATE 0.12 % MT SOLN: 15.0000 mL | Freq: Once | OROMUCOSAL | Status: AC

## 2019-09-06 MED ORDER — MIDAZOLAM HCL 2 MG/2ML IJ SOLN
INTRAMUSCULAR | Status: DC | PRN
  Administered 2019-09-06: 2 mg via INTRAVENOUS

## 2019-09-06 MED ORDER — GLYCOPYRROLATE 0.2 MG/ML IJ SOLN
INTRAMUSCULAR | Status: DC | PRN
  Administered 2019-09-06: .2 mg via INTRAVENOUS

## 2019-09-06 MED ORDER — SODIUM CHLORIDE (PF) 0.9 % IJ SOLN
INTRAMUSCULAR | Status: AC
  Filled 2019-09-06: qty 20

## 2019-09-06 MED ORDER — PROPOFOL 500 MG/50ML IV EMUL
INTRAVENOUS | Status: AC
  Filled 2019-09-06: qty 50

## 2019-09-06 MED ORDER — PROPOFOL 10 MG/ML IV BOLUS
INTRAVENOUS | Status: DC | PRN
  Administered 2019-09-06: 200 mg via INTRAVENOUS

## 2019-09-06 MED ORDER — PROPOFOL 10 MG/ML IV BOLUS
INTRAVENOUS | Status: AC
  Filled 2019-09-06: qty 20

## 2019-09-06 MED ORDER — OXYCODONE HCL 5 MG/5ML PO SOLN: 5.0000 mg | Freq: Once | ORAL | Status: AC | PRN

## 2019-09-06 MED ORDER — FENTANYL CITRATE (PF) 100 MCG/2ML IJ SOLN: 25.0000 ug | INTRAMUSCULAR | Status: DC | PRN

## 2019-09-06 MED ORDER — FAMOTIDINE 20 MG PO TABS: 20.0000 mg | ORAL_TABLET | Freq: Once | ORAL | Status: AC

## 2019-09-06 MED ORDER — DEXAMETHASONE SODIUM PHOSPHATE 10 MG/ML IJ SOLN
INTRAMUSCULAR | Status: DC | PRN
  Administered 2019-09-06: 10 mg via INTRAVENOUS

## 2019-09-06 MED ORDER — ORAL CARE MOUTH RINSE: 15.0000 mL | Freq: Once | OROMUCOSAL | Status: AC

## 2019-09-06 MED ORDER — GLYCOPYRROLATE 0.2 MG/ML IJ SOLN
INTRAMUSCULAR | Status: AC
  Filled 2019-09-06: qty 1

## 2019-09-06 MED ORDER — FAMOTIDINE 20 MG PO TABS
ORAL_TABLET | ORAL | Status: AC
  Administered 2019-09-06: 20 mg via ORAL
  Filled 2019-09-06: qty 1

## 2019-09-06 MED ORDER — SUCCINYLCHOLINE CHLORIDE 200 MG/10ML IV SOSY
PREFILLED_SYRINGE | INTRAVENOUS | Status: AC
  Filled 2019-09-06: qty 10

## 2019-09-06 MED ORDER — CHLORHEXIDINE GLUCONATE 0.12 % MT SOLN
OROMUCOSAL | Status: AC
  Administered 2019-09-06: 15 mL via OROMUCOSAL
  Filled 2019-09-06: qty 15

## 2019-09-06 MED ORDER — ONDANSETRON HCL 4 MG/2ML IJ SOLN
INTRAMUSCULAR | Status: DC | PRN
  Administered 2019-09-06: 4 mg via INTRAVENOUS

## 2019-09-06 MED ORDER — REMIFENTANIL HCL 1 MG IV SOLR
INTRAVENOUS | Status: AC
  Filled 2019-09-06: qty 1000

## 2019-09-06 MED ORDER — LACTATED RINGERS IV SOLN: INTRAVENOUS | Status: DC

## 2019-09-06 MED ORDER — BUPIVACAINE-EPINEPHRINE (PF) 0.5% -1:200000 IJ SOLN
INTRAMUSCULAR | Status: AC
  Filled 2019-09-06: qty 30

## 2019-09-06 MED ORDER — MIDAZOLAM HCL 2 MG/2ML IJ SOLN
INTRAMUSCULAR | Status: AC
  Filled 2019-09-06: qty 2

## 2019-09-06 MED ORDER — FENTANYL CITRATE (PF) 100 MCG/2ML IJ SOLN
INTRAMUSCULAR | Status: DC | PRN
  Administered 2019-09-06: 100 ug via INTRAVENOUS

## 2019-09-06 MED ORDER — EPHEDRINE SULFATE 50 MG/ML IJ SOLN
INTRAMUSCULAR | Status: DC | PRN
  Administered 2019-09-06 (×2): 10 mg via INTRAVENOUS

## 2019-09-06 MED ORDER — KETAMINE HCL 50 MG/ML IJ SOLN
INTRAMUSCULAR | Status: DC | PRN
  Administered 2019-09-06 (×2): 25 mg via INTRAVENOUS

## 2019-09-06 MED ORDER — FENTANYL CITRATE (PF) 100 MCG/2ML IJ SOLN
INTRAMUSCULAR | Status: AC
  Filled 2019-09-06: qty 2

## 2019-09-06 MED ORDER — OXYCODONE HCL 5 MG PO TABS
ORAL_TABLET | ORAL | Status: AC
  Filled 2019-09-06: qty 1

## 2019-09-06 MED ORDER — OXYCODONE HCL 5 MG PO TABS
5.0000 mg | ORAL_TABLET | Freq: Once | ORAL | Status: AC | PRN
  Administered 2019-09-06: 5 mg via ORAL

## 2019-09-06 MED ORDER — DEXAMETHASONE SODIUM PHOSPHATE 10 MG/ML IJ SOLN
INTRAMUSCULAR | Status: AC
  Filled 2019-09-06: qty 1

## 2019-09-06 MED ORDER — REMIFENTANIL HCL 1 MG IV SOLR
INTRAVENOUS | Status: DC | PRN
  Administered 2019-09-06: .1 ug/kg/min via INTRAVENOUS

## 2019-09-06 MED ORDER — OXYCODONE HCL 5 MG PO TABS: 5.0000 mg | ORAL_TABLET | Freq: Four times a day (QID) | ORAL | 0 refills | Status: AC | PRN

## 2019-09-06 MED ORDER — CEFAZOLIN SODIUM-DEXTROSE 2-4 GM/100ML-% IV SOLN
INTRAVENOUS | Status: AC
  Filled 2019-09-06: qty 100

## 2019-09-06 MED ORDER — SUCCINYLCHOLINE CHLORIDE 20 MG/ML IJ SOLN
INTRAMUSCULAR | Status: DC | PRN
  Administered 2019-09-06: 100 mg via INTRAVENOUS

## 2019-09-06 MED ORDER — PROPOFOL 500 MG/50ML IV EMUL
INTRAVENOUS | Status: DC | PRN
  Administered 2019-09-06: 125 ug/kg/min via INTRAVENOUS

## 2019-09-06 MED ORDER — LIDOCAINE HCL (CARDIAC) PF 100 MG/5ML IV SOSY
PREFILLED_SYRINGE | INTRAVENOUS | Status: DC | PRN
  Administered 2019-09-06: 50 mg via INTRAVENOUS

## 2019-09-06 MED ORDER — ONDANSETRON HCL 4 MG/2ML IJ SOLN
INTRAMUSCULAR | Status: AC
  Filled 2019-09-06: qty 2

## 2019-09-06 MED ORDER — PENTAFLUOROPROP-TETRAFLUOROETH EX AERO
INHALATION_SPRAY | CUTANEOUS | Status: AC
  Filled 2019-09-06: qty 30

## 2019-09-06 MED ORDER — SODIUM CHLORIDE 0.9 % IV SOLN
INTRAVENOUS | Status: DC | PRN
  Administered 2019-09-06: 25 ug/min via INTRAVENOUS

## 2019-09-06 SURGICAL SUPPLY — 71 items
ADH SKN CLS APL DERMABOND .7 (GAUZE/BANDAGES/DRESSINGS) ×1
AGENT HMST MTR 8 SURGIFLO (HEMOSTASIS)
APL PRP STRL LF DISP 70% ISPRP (MISCELLANEOUS) ×1
APL SRG 60D 8 XTD TIP BNDBL (TIP)
BLADE BOVIE TIP EXT 4 (BLADE) ×2 IMPLANT
BUR NEURO DRILL SOFT 3.0X3.8M (BURR) ×2 IMPLANT
CANISTER SUCT 1200ML W/VALVE (MISCELLANEOUS) ×4 IMPLANT
CHLORAPREP W/TINT 26 (MISCELLANEOUS) ×2 IMPLANT
CNTNR SPEC 2.5X3XGRAD LEK (MISCELLANEOUS) ×1
CONT SPEC 4OZ STER OR WHT (MISCELLANEOUS) ×1
CONT SPEC 4OZ STRL OR WHT (MISCELLANEOUS) ×1
CONTAINER SPEC 2.5X3XGRAD LEK (MISCELLANEOUS) ×1 IMPLANT
COUNTER NEEDLE 20/40 LG (NEEDLE) ×2 IMPLANT
COVER LIGHT HANDLE STERIS (MISCELLANEOUS) ×4 IMPLANT
COVER WAND RF STERILE (DRAPES) ×2 IMPLANT
CUP MEDICINE 2OZ PLAST GRAD ST (MISCELLANEOUS) ×2 IMPLANT
DERMABOND ADVANCED (GAUZE/BANDAGES/DRESSINGS) ×1
DERMABOND ADVANCED .7 DNX12 (GAUZE/BANDAGES/DRESSINGS) ×1 IMPLANT
DRAPE C-ARM XRAY 36X54 (DRAPES) ×4 IMPLANT
DRAPE LAPAROTOMY 100X77 ABD (DRAPES) ×2 IMPLANT
DRAPE MICROSCOPE SPINE 48X150 (DRAPES) IMPLANT
DRAPE SURG 17X11 SM STRL (DRAPES) ×2 IMPLANT
DRSG TEGADERM 4X4.75 (GAUZE/BANDAGES/DRESSINGS) ×2 IMPLANT
DURASEAL APPLICATOR TIP (TIP) IMPLANT
DURASEAL SPINE SEALANT 3ML (MISCELLANEOUS) IMPLANT
ELECT CAUTERY BLADE TIP 2.5 (TIP) ×2
ELECT EZSTD 165MM 6.5IN (MISCELLANEOUS) ×2
ELECT REM PT RETURN 9FT ADLT (ELECTROSURGICAL) ×2
ELECTRODE CAUTERY BLDE TIP 2.5 (TIP) ×1 IMPLANT
ELECTRODE EZSTD 165MM 6.5IN (MISCELLANEOUS) ×1 IMPLANT
ELECTRODE REM PT RTRN 9FT ADLT (ELECTROSURGICAL) ×1 IMPLANT
FEE INTRAOP MONITOR IMPULS NCS (MISCELLANEOUS) ×1 IMPLANT
GAUZE SPONGE 4X4 12PLY STRL (GAUZE/BANDAGES/DRESSINGS) IMPLANT
GLOVE BIOGEL PI IND STRL 7.0 (GLOVE) ×1 IMPLANT
GLOVE BIOGEL PI IND STRL 8 (GLOVE) ×1 IMPLANT
GLOVE BIOGEL PI INDICATOR 7.0 (GLOVE) ×1
GLOVE BIOGEL PI INDICATOR 8 (GLOVE) ×1
GLOVE SURG SYN 7.0 (GLOVE) ×4 IMPLANT
GLOVE SURG SYN 8.0 (GLOVE) ×2 IMPLANT
GOWN STRL REUS W/ TWL LRG LVL3 (GOWN DISPOSABLE) ×1 IMPLANT
GOWN STRL REUS W/ TWL XL LVL3 (GOWN DISPOSABLE) ×2 IMPLANT
GOWN STRL REUS W/TWL LRG LVL3 (GOWN DISPOSABLE) ×2
GOWN STRL REUS W/TWL XL LVL3 (GOWN DISPOSABLE) ×4
GRADUATE 1200CC STRL 31836 (MISCELLANEOUS) ×2 IMPLANT
INTRAOP MONITOR FEE IMPULS NCS (MISCELLANEOUS) ×1
INTRAOP MONITOR FEE IMPULSE (MISCELLANEOUS) ×1
KIT TURNOVER KIT A (KITS) ×2 IMPLANT
LEAD KIT TRAIL 90CM (Lead) ×4 IMPLANT
MARKER SKIN DUAL TIP RULER LAB (MISCELLANEOUS) ×2 IMPLANT
NDL SAFETY ECLIPSE 18X1.5 (NEEDLE) ×1 IMPLANT
NEEDLE HYPO 18GX1.5 SHARP (NEEDLE) ×2
NEEDLE HYPO 22GX1.5 SAFETY (NEEDLE) ×2 IMPLANT
NS IRRIG 1000ML POUR BTL (IV SOLUTION) ×2 IMPLANT
PACK LAMINECTOMY NEURO (CUSTOM PROCEDURE TRAY) ×2 IMPLANT
PAD ARMBOARD 7.5X6 YLW CONV (MISCELLANEOUS) ×2 IMPLANT
SPOGE SURGIFLO 8M (HEMOSTASIS)
SPONGE SURGIFLO 8M (HEMOSTASIS) IMPLANT
STAPLER SKIN PROX 35W (STAPLE) IMPLANT
STIMULATOR WIRELESS 74X79X20MM (MISCELLANEOUS) ×2 IMPLANT
SUT ETHILON 3-0 FS-10 30 BLK (SUTURE) ×4
SUT POLYSORB 2-0 5X18 GS-10 (SUTURE) ×4 IMPLANT
SUT SILK 2 0 SH (SUTURE) ×4 IMPLANT
SUT VIC AB 0 CT1 18XCR BRD 8 (SUTURE) ×2 IMPLANT
SUT VIC AB 0 CT1 8-18 (SUTURE) ×4
SUTURE EHLN 3-0 FS-10 30 BLK (SUTURE) ×2 IMPLANT
SYR 10ML LL (SYRINGE) ×4 IMPLANT
SYR 20ML LL LF (SYRINGE) ×2 IMPLANT
SYR 30ML LL (SYRINGE) ×4 IMPLANT
SYR 3ML LL SCALE MARK (SYRINGE) ×2 IMPLANT
TOWEL OR 17X26 4PK STRL BLUE (TOWEL DISPOSABLE) ×4 IMPLANT
TUBING CONNECTING 10 (TUBING) ×2 IMPLANT

## 2019-09-06 NOTE — H&P (Signed)
Maxwell [name] is an 54 y.o. male.   Chief Complaint: Chronic Back Pain HPI: [name] is here for evaluation of ongoing chronic back pain after a prior lumbar decompression. He states he is not having any current leg pain or numbness but the back pain is very bothersome. This impedes with his ability to do work and other activity. He is unable to lift heavy objects without severe pain. He has tried therapy in the past but is reluctant to try injections given pain with the procedure. He has been seen by the pain clinic and deferred a spinal cord stimulator trial in the past but he would like to consider it at this time.    Past Medical History:  Diagnosis Date  . Bulging lumbar disc   . Chronic pain    low back  . DDD (degenerative disc disease), lumbar   . History of kidney stones     Past Surgical History:  Procedure Laterality Date  . LUMBAR LAMINECTOMY/DECOMPRESSION MICRODISCECTOMY Right 05/28/2017   Procedure: LUMBAR LAMINECTOMY/DECOMPRESSION MICRODISCECTOMY 1 LEVEL-L4-5;  Surgeon: Venetia Night, MD;  Location: [facility] ORS;  Service: Neurosurgery;  Laterality: Right;    Family History  Family history unknown: Yes   Social History:  reports that he has been smoking cigars. He has quit using smokeless tobacco.  His smokeless tobacco use included snuff. He reports current alcohol use of about 2.0 standard drinks of alcohol per week. He reports that he does not use drugs.  Allergies: No Known Allergies  Medications Prior to Admission  Medication Sig Dispense Refill  . Multiple Vitamins-Minerals (MULTIVITAMIN PO) Take 10 tablets by mouth daily.     . Pseudoeph-Doxylamine-DM-APAP (NYQUIL PO) Take 1 Dose by mouth at bedtime as needed (sleep).    . methocarbamol (ROBAXIN) 750 MG tablet Take 1.5 tablets (1,125 mg total) by mouth every 8 (eight) hours as needed for muscle spasms. 90 tablet 5  . tadalafil (CIALIS) 20 MG tablet TAKE 1 TABLET DAILY AS NEEDED FOR ERECTILE DYSFUNCTION 54  tablet 0    No results found for this or any previous visit (from the past 48 hour(s)). No results found.  Review of Systems General ROS: Negative Psychological ROS: Negative Ophthalmic ROS: Negative ENT ROS: Negative Hematological and Lymphatic ROS: Negative  Endocrine ROS: Negative Respiratory ROS: Negative Cardiovascular ROS: Negative Gastrointestinal ROS: Negative Genito-Urinary ROS: Negative Musculoskeletal ROS: positive for back pain Neurological ROS: Negative for leg pain or numbness Dermatological ROS: Negative  There were no vitals taken for this visit. Physical Exam  General appearance: Alert, cooperative, in no acute distress Head: Normocephalic, atraumatic Eyes: Normal, EOM intact Oropharynx: Moist without lesions Back: Well-healed midline incision Ext: No edema in LE bilaterally  Neurologic exam:  Mental status: alertness: alert, affect: normal Speech: fluent and clear Motor:strength symmetric 5/5 in bilateral hip flexion, knee flexion, knee extension, dorsiflexion, plantarflexion Sensory: intact to light touch in bilateral lower extremities Gait: normal   MRI lumbar spine: There is a normal without a curvature. The displaced heights remain well-preserved. There is evidence of previous decompression at the L4-5 level. There is no obvious residual stenosis.   Assessment/Plan Thoracic SCS Percutaneous Trial  Lucy Chris, MD 09/06/2019, 9:04 AM

## 2019-09-06 NOTE — Anesthesia Procedure Notes (Signed)
Procedure Name: Intubation Performed by: Briellah Baik, CRNA Pre-anesthesia Checklist: Patient identified, Patient being monitored, Timeout performed, Emergency Drugs available and Suction available Patient Re-evaluated:Patient Re-evaluated prior to induction Oxygen Delivery Method: Circle system utilized Preoxygenation: Pre-oxygenation with 100% oxygen Induction Type: IV induction Ventilation: Mask ventilation without difficulty Laryngoscope Size: McGraph and 4 Grade View: Grade I Tube type: Oral Tube size: 7.5 mm Number of attempts: 1 Airway Equipment and Method: Stylet,  Video-laryngoscopy and LTA kit utilized Placement Confirmation: ETT inserted through vocal cords under direct vision,  positive ETCO2 and breath sounds checked- equal and bilateral Secured at: 22 cm Tube secured with: Tape Dental Injury: Teeth and Oropharynx as per pre-operative assessment        

## 2019-09-06 NOTE — Anesthesia Preprocedure Evaluation (Signed)
Anesthesia Evaluation  Patient identified by MRN, date of birth, ID band Patient awake    Reviewed: Allergy & Precautions, H&P , NPO status , Patient's Chart, lab work & pertinent test results  History of Anesthesia Complications Negative for: history of anesthetic complications  Airway Mallampati: II  TM Distance: >3 FB Neck ROM: full    Dental  (+) Chipped   Pulmonary neg shortness of breath, Current Smoker and Patient abstained from smoking.,    Pulmonary exam normal        Cardiovascular Exercise Tolerance: Good (-) angina(-) Past MI and (-) DOE negative cardio ROS Normal cardiovascular exam     Neuro/Psych negative neurological ROS  negative psych ROS   GI/Hepatic negative GI ROS, Neg liver ROS, neg GERD  ,  Endo/Other  negative endocrine ROS  Renal/GU      Musculoskeletal  (+) Arthritis ,   Abdominal   Peds  Hematology negative hematology ROS (+)   Anesthesia Other Findings Past Medical History: No date: Bulging lumbar disc No date: Chronic pain     Comment:  low back No date: DDD (degenerative disc disease), lumbar No date: History of kidney stones  Past Surgical History: 05/28/2017: LUMBAR LAMINECTOMY/DECOMPRESSION MICRODISCECTOMY; Right     Comment:  Procedure: LUMBAR LAMINECTOMY/DECOMPRESSION               MICRODISCECTOMY 1 LEVEL-L4-5;  Surgeon: Venetia Night, MD;  Location: [facility] ORS;  Service: Neurosurgery;              Laterality: Right;     Reproductive/Obstetrics negative OB ROS                             Anesthesia Physical Anesthesia Plan  ASA: II  Anesthesia Plan: General ETT   Post-op Pain Management:    Induction: Intravenous  PONV Risk Score and Plan: Ondansetron, Dexamethasone, Midazolam and Treatment may vary due to age or medical condition  Airway Management Planned: Oral ETT  Additional Equipment:   Intra-op Plan:    Post-operative Plan: Extubation in OR  Informed Consent: I have reviewed the patients History and Physical, chart, labs and discussed the procedure including the risks, benefits and alternatives for the proposed anesthesia with the patient or authorized representative who has indicated his/her understanding and acceptance.     Dental Advisory Given  Plan Discussed with: Anesthesiologist, CRNA and Surgeon  Anesthesia Plan Comments: (Patient consented for risks of anesthesia including but not limited to:  - adverse reactions to medications - damage to eyes, teeth, lips or other oral mucosa - nerve damage due to positioning  - sore throat or hoarseness - Damage to heart, brain, nerves, lungs, other parts of body or loss of life  Patient voiced understanding.)        Anesthesia Quick Evaluation

## 2019-09-06 NOTE — Interval H&P Note (Signed)
History and Physical Interval Note:  09/06/2019 9:26 AM  Maxwell [name]  has presented today for surgery, with the diagnosis of chronic pain g89.4.  The various methods of treatment have been discussed with the patient and family. After consideration of risks, benefits and other options for treatment, the patient has consented to  Procedure(s): THORACIC SPINAL CORD STIMULATOR PERCUTANEOUS TRIAL (Bilateral) as a surgical intervention.  The patient's history has been reviewed, patient examined, no change in status, stable for surgery.  I have reviewed the patient's chart and labs.  Questions were answered to the patient's satisfaction.     Lucy Chris

## 2019-09-06 NOTE — Transfer of Care (Signed)
Immediate Anesthesia Transfer of Care Note  Patient: Maxwell [name]  Procedure(s) Performed: THORACIC SPINAL CORD STIMULATOR PERCUTANEOUS TRIAL (Bilateral )  Patient Location: PACU  Anesthesia Type:General  Level of Consciousness: sedated  Airway & Oxygen Therapy: Patient Spontanous Breathing and Patient connected to face mask oxygen  Post-op Assessment: Report given to RN and Post -op Vital signs reviewed and stable  Post vital signs: Reviewed  Last Vitals:  Vitals Value Taken Time  BP    Temp    Pulse 70 09/06/19 1113  Resp    SpO2 98 % 09/06/19 1113  Vitals shown include unvalidated device data.  Last Pain:  Vitals:   09/06/19 0921  TempSrc: Temporal         Complications: No complications documented.

## 2019-09-06 NOTE — Op Note (Signed)
SURGERY DATE:09/06/2019  PRE-OP DIAGNOSIS: Chronic pain syndrome  POST-OP DIAGNOSIS:Post-Op Diagnosis Codes: Chronic pain syndrome  Procedure(s) with comments: Bilateral percutaneous spinal cord stimulator lead placement   SURGEON:  * Nathaniel Man, MD Patsey Berthold - assistant   ANESTHESIA: TIVA general anesthesia  OPERATIVE FINDINGS:Successful placement of thoracic spinal cord stimulatorleads  Indication Maxwell [name] seen in clinic on 6/22 with ongoing back pain. MRI of the spine revealed no concerning stenosis at the level of the implant. The patient wished to proceed with SCS trial for treatment of his pain. Risks including weakness, hematoma, infection, failure of pain relief, post-operative pain,  stroke, heart attack, pneumonia, and spinal cord injury were discussed.    Procedure The patient was brought to the operating room where vascular access was obtained andintubated. He was placed prone on gel rolls. Antibiotics were given. Fluoroscopy was used to confirm planned entry inlumbar area at the level of L3. The patient was prepped and draped in a sterile fashion. A hard time out was performed. Local anesthetic was instilled into planned entry sites. MEP and SSEP were established.  Next, a Touhy needle was used to insert in a paramedian approach to enter the interlaminar space between L2 and L3. Once loss of resistance was identified, this was confirmed with a metal stylette. Next, the percutaneous lead was passed in the rostral direction using fluoroscopy as guidance to keep in the midline. This was passed without resistance to the level of the T7 vertebral body.  Next, the same procedure was performed contralaterally entering at L3/4 placing an additional lead in the midline at the T8 vertebral body space, slightly offset to the previous lead to allow for better coverage. Lateral views were obtained to confirm we were in the dorsal epidural  space.We used the programmer to ensure these were covering the patients areas of pain. The leads were then secured to theskin after removal of the 2 needles and stylette.  A final fluoroscopic image was taken show good placement ofpercutaneous leads. Sterile dressings were applied. The patient was returned to supine position and extubated. the patient was seen to be moving all extremities symmetrically and was taken to PACU for recovery. The family was updated and all questions answered.   ESTIMATED BLOOD LOSS: 10cc   IMPLANT NEURO LEAD TRAIL 90CM - [id number]  Inventory Item: NEURO LEAD TRAIL 90CM Serial no.:  Model/Cat no.: [id number]  Implant name: NEURO LEAD TRAIL 90CM - [id number] Laterality:  Area: Spine Thoracic  Manufacturer: [employer] NEUROMOD PAIN MGMT Date of Manufacture:    Action: Implanted Number Used: 1   Device Identifier:  Device Identifier Type:    NEURO LEAD TRAIL 90CM - [id number]  Inventory Item: NEURO LEAD TRAIL 90CM Serial no.:  Model/Cat no.: [id number]  Implant name: NEURO LEAD TRAIL 90CM - [id number] Laterality:  Area: Spine Thoracic  Manufacturer: [employer] NEUROMOD PAIN MGMT Date of Manufacture:    Action: Implanted Number Used: 1   Device Identifier:  Device Identifier Type:       I performed the case in its entiretywithassistance of Patsey Berthold, PA  Lucy Chris, MD [phone]

## 2019-09-06 NOTE — Discharge Summary (Signed)
Procedure: Spinal cord stimulator trial Procedure date: 09/06/2019 Diagnosis: chronic pain   History: Maxwell [name] is s/p spinal cord stimulator percutaneous lead trial POD: Tolerated procedure well. Evaluated in post op recovery still disoriented from anesthesia but able to answer questions and obey commands.   Physical Exam: Vitals:   09/06/19 1137 09/06/19 1145  BP:  116/75  Pulse: 56 56  Resp:  (!) 8  Temp:    SpO2: 100% 100%    General: Alert and oriented, lying in bed Strength:5/5 throughout  Sensation: intact and symmetric throughout  Skin: dressing clean, dry, intact  Data:  No results for input(s): NA, K, CL, CO2, BUN, CREATININE, LABGLOM, GLUCOSE, CALCIUM in the last 168 hours. No results for input(s): AST, ALT, ALKPHOS in the last 168 hours.  Invalid input(s): TBILI   No results for input(s): WBC, HGB, HCT, PLT in the last 168 hours. No results for input(s): APTT, INR in the last 168 hours.         Assessment/Plan:  Maxwell [name] is POD0 s/p SCS trial.  Once he is able to ambulate, void, and tolerate PO, he can be discharged to home. He will f/u with [place] in one week in clinic. He knows to keep bandage in place and to not shower. He should avoid blood thinners and/or NSAIDs.   Patsey Berthold, NP Department of Neurosurgery

## 2019-09-06 NOTE — Discharge Instructions (Addendum)
AMBULATORY SURGERY  DISCHARGE INSTRUCTIONS   1) The drugs that you were given will stay in your system until tomorrow so for the next 24 hours you should not:  A) Drive an automobile B) Make any legal decisions C) Drink any alcoholic beverage   2) You may resume regular meals tomorrow.  Today it is better to start with liquids and gradually work up to solid foods.  You may eat anything you prefer, but it is better to start with liquids, then soup and crackers, and gradually work up to solid foods.   3) Please notify your doctor immediately if you have any unusual bleeding, trouble breathing, redness and pain at the surgery site, drainage, fever, or pain not relieved by medication.    4) Additional Instructions:        Please contact your physician with any problems or Same Day Surgery at [phone], Monday through Friday 6 am to 4 pm, or [place] [facility] number at [phone]. Your surgeon has performed an operation on back to implant spinal cord stimulator leads. Many times, patients feel better immediately after surgery and can "overdo it." Even if you feel well, it is important that you follow these activity guidelines. If you do not let your back heal properly from the surgery, yo may move the leads. The following are instructions to help in your recovery once you have been discharged from the hospital.   AVOID ALL NSAIDS or blood thinners (ASPIRIN, IBUPROFEN, GOODY powder, etc) KEEP BANDAGE OVER BACK ON, DO NOT SHOWER OR GET IT WET.   Activity    No bending, lifting, or twisting ("BLT"). Avoid lifting objects heavier than 10 pounds (gallon milk jug).  Where possible, avoid household activities that involve lifting, bending, pushing, or pulling such as laundry, vacuuming, grocery shopping, and childcare. Try to arrange for help from friends and family for these activities while your back heals.  Increase physical activity slowly as tolerated.  Taking short  walks is encouraged, but avoid strenuous exercise. Do not jog, run, bicycle, lift weights, or participate in any other exercises unless specifically allowed by your doctor. Avoid prolonged sitting, including car rides.  Talk to your doctor before resuming sexual activity.  You should not drive until cleared by your doctor.  Until released by your doctor, you should not return to work or school.  You should rest at home and let your body heal.   You may shower two days after your surgery.  After showering, lightly dab your incision dry. Do not take a tub bath or go swimming for 3 weeks, or until approved by your doctor at your follow-up appointment.  If you smoke, we strongly recommend that you quit.  Smoking has been proven to interfere with normal healing in your back and will dramatically reduce the success rate of your surgery. Please contact [facility] (800-QUIT-NOW) and use the resources at www.[facility].com for assistance in stopping smoking.  Surgical Incision  KEEP BANDAGE IN PLACE. DO NOT REMOVE.  Diet            You may return to your usual diet. Be sure to stay hydrated.  When to Contact us   should you experience any of the following, contact us immediately: . New numbness or weakness . Pain that is progressively getting worse, and is not relieved by your pain medications or rest . Bleeding, redness, swelling, pain, or drainage from surgical incision . Chills or flu-like symptoms . Fever greater than 101.0 F (38.3  C) . Problems with bowel or bladder functions . Difficulty breathing or shortness of breath . Warmth, tenderness, or swelling in your calf  Contact Information . During office hours (Monday-Friday 9 am to 5 pm), please call your physician at [phone] . After hours and weekends, please call [phone] and an answering service will put you in touch with either Dr. Adriana Simas or Dr. Myer Haff.  . For a life-threatening emergency, call 911

## 2019-09-06 NOTE — Progress Notes (Signed)
Pt ambulated to BR voided without difficulty.Tolerated po fluids and food well.

## 2019-09-07 ENCOUNTER — Encounter: Payer: Self-pay | Admitting: Neurosurgery

## 2019-09-07 NOTE — Anesthesia Postprocedure Evaluation (Signed)
Anesthesia Post Note  Patient: NAHEEM MOSCO  Procedure(s) Performed: THORACIC SPINAL CORD STIMULATOR PERCUTANEOUS TRIAL (Bilateral )  Patient location during evaluation: PACU Anesthesia Type: General Level of consciousness: awake and alert Pain management: pain level controlled Vital Signs Assessment: post-procedure vital signs reviewed and stable Respiratory status: spontaneous breathing, nonlabored ventilation, respiratory function stable and patient connected to nasal cannula oxygen Cardiovascular status: blood pressure returned to baseline and stable Postop Assessment: no apparent nausea or vomiting Anesthetic complications: no   No complications documented.   Last Vitals:  Vitals:   09/06/19 1215 09/06/19 1312  BP: (!) 118/86 (!) 128/88  Pulse: 55 53  Resp: 16 16  Temp: (!) 36.1 C 36.6 C  SpO2: 100% 100%    Last Pain:  Vitals:   09/06/19 1312  TempSrc: Temporal  PainSc: 7                  Cleda Mccreedy Jasiel Apachito

## 2019-10-04 DIAGNOSIS — Z9889 Other specified postprocedural states: Secondary | ICD-10-CM | POA: Diagnosis not present

## 2019-10-26 NOTE — Telephone Encounter (Signed)
, °

## 2019-12-16 DIAGNOSIS — G894 Chronic pain syndrome: Secondary | ICD-10-CM | POA: Diagnosis not present

## 2020-01-05 ENCOUNTER — Other Ambulatory Visit: Payer: Self-pay | Admitting: Family Medicine

## 2020-01-05 DIAGNOSIS — N529 Male erectile dysfunction, unspecified: Secondary | ICD-10-CM

## 2020-01-05 NOTE — Telephone Encounter (Signed)
Requested medications are due for refill today yes  Requested medications are on the active medication list yes  Last refill 04/2019  Last visit 10/2017  Future visit scheduled no  Notes to clinic Failed protocol of valid visit within 12 months, please assess.

## 2020-01-05 NOTE — Telephone Encounter (Signed)
Requested medications are due for refill today yes  Requested medications are on the active medication list yes  Last refill 04/2019  Last visit 10/2017  Future visit scheduled no  Notes to clinic Failed protocol of valid visit within 12 months, please assess.  

## 2020-02-15 ENCOUNTER — Other Ambulatory Visit: Payer: Self-pay | Admitting: Student in an Organized Health Care Education/Training Program

## 2020-02-15 DIAGNOSIS — M961 Postlaminectomy syndrome, not elsewhere classified: Secondary | ICD-10-CM

## 2020-02-15 DIAGNOSIS — G894 Chronic pain syndrome: Secondary | ICD-10-CM

## 2020-02-29 ENCOUNTER — Ambulatory Visit
Payer: BC Managed Care – PPO | Attending: Student in an Organized Health Care Education/Training Program | Admitting: Student in an Organized Health Care Education/Training Program

## 2020-02-29 ENCOUNTER — Other Ambulatory Visit: Payer: Self-pay

## 2020-02-29 ENCOUNTER — Encounter: Payer: Self-pay | Admitting: Student in an Organized Health Care Education/Training Program

## 2020-02-29 VITALS — BP 133/84 | HR 80 | Temp 97.2°F | Resp 16 | Ht 70.0 in | Wt 198.0 lb

## 2020-02-29 DIAGNOSIS — M47816 Spondylosis without myelopathy or radiculopathy, lumbar region: Secondary | ICD-10-CM | POA: Diagnosis not present

## 2020-02-29 DIAGNOSIS — G894 Chronic pain syndrome: Secondary | ICD-10-CM

## 2020-02-29 DIAGNOSIS — Z9889 Other specified postprocedural states: Secondary | ICD-10-CM | POA: Diagnosis not present

## 2020-02-29 DIAGNOSIS — M7918 Myalgia, other site: Secondary | ICD-10-CM

## 2020-02-29 DIAGNOSIS — M961 Postlaminectomy syndrome, not elsewhere classified: Secondary | ICD-10-CM | POA: Diagnosis not present

## 2020-02-29 MED ORDER — METHOCARBAMOL 750 MG PO TABS: 1125.0000 mg | ORAL_TABLET | Freq: Three times a day (TID) | ORAL | 5 refills | Status: DC | PRN

## 2020-02-29 NOTE — Progress Notes (Signed)
PROVIDER NOTE: Information contained herein reflects review and annotations entered in association with encounter. Interpretation of such information and data should be left to medically-trained personnel. Information provided to patient can be located elsewhere in the medical record under "Patient Instructions". Document created using STT-dictation technology, any transcriptional errors that may result from process are unintentional.    Patient: Maxwell [name]  Service Category: E/M  Provider: Gillis Santa, MD  DOB: 1965-08-14  DOS: 02/29/2020  Specialty: Interventional Pain Management  MRN: [id number]  Setting: Ambulatory outpatient  PCP: Juline Patch, MD  Type: Established Patient    Referring Provider: Juline Patch, MD  Location: Office  Delivery: Face-to-face     HPI  Maxwell [name], a 55 y.o. year old male, is here today because of his Lumbar post-laminectomy syndrome [M96.1]. Maxwell [name] primary complain today is Back Pain (Right, lower) Last encounter: My last encounter with him was on 02/15/2020. Pertinent problems: Maxwell [name] does not have any pertinent problems on file. Pain Assessment: Severity of Chronic pain is reported as a 5 /10. Location: Back Right,Lower/denies. Onset: More than a month ago. Quality: Aching,Throbbing. Timing: Intermittent. Modifying factor(s): Robaxin. Vitals:  height is _0  (1.778 m) and weight is 198 lb (89.8 kg). His temporal temperature is 97.2 F (36.2 C) (abnormal). His blood pressure is 133/84 and his pulse is 80. His respiration is 16 and oxygen saturation is 99%.   Reason for encounter: medication management.    No change in medical history since last visit.  Patient's pain is at baseline.  Patient continues Robaxin prn, here for refill.   Unfortunately no benefit with spinal cord stimulator trial done 09/06/2019.   ROS  Constitutional: Denies any fever or chills Gastrointestinal: No reported hemesis, hematochezia, vomiting, or acute  GI distress Musculoskeletal: Denies any acute onset joint swelling, redness, loss of ROM, or weakness Neurological: low back pain, right hip and thigh pain  Medication Review  Multiple Vitamin, Pseudoeph-Doxylamine-DM-APAP, methocarbamol, and tadalafil  History Review  Allergy: Maxwell [name] has No Known Allergies. Drug: Maxwell [name]  reports no history of drug use. Alcohol:  reports current alcohol use of about 2.0 standard drinks of alcohol per week. Tobacco:  reports that he has been smoking cigars. He has quit using smokeless tobacco.  His smokeless tobacco use included snuff. Social: Maxwell [name]  reports that he has been smoking cigars. He has quit using smokeless tobacco.  His smokeless tobacco use included snuff. He reports current alcohol use of about 2.0 standard drinks of alcohol per week. He reports that he does not use drugs. Medical:  has a past medical history of Bulging lumbar disc, Chronic pain, DDD (degenerative disc disease), lumbar, and History of kidney stones. Surgical: Maxwell [name]  has a past surgical history that includes Lumbar laminectomy/decompression microdiscectomy (Right, 05/28/2017) and Spinal cord stimulator trial (Bilateral, 09/06/2019). Family: Family history is unknown by patient.  Laboratory Chemistry Profile   Renal Lab Results  Component Value Date   BUN 15 08/30/2019   CREATININE 1.37 (H) 08/30/2019   GFRAA >60 08/30/2019   GFRNONAA 58 (L) 08/30/2019     Hepatic No results found for: AST, ALT, ALBUMIN, ALKPHOS, HCVAB, AMYLASE, LIPASE, AMMONIA   Electrolytes Lab Results  Component Value Date   NA 137 08/30/2019   K 3.5 08/30/2019   CL 104 08/30/2019   CALCIUM 9.1 08/30/2019     Bone No results found for: [place], VD125OH2TOT, MW4132GM0, NU2725DG6, 25OHVITD1, 25OHVITD2, 25OHVITD3, TESTOFREE, TESTOSTERONE   Inflammation (  CRP: Acute Phase) (ESR: Chronic Phase) No results found for: CRP, ESRSEDRATE, LATICACIDVEN     Note: Above Lab results  reviewed.  Recent Imaging Review  DG C-Arm 1-60 Min CLINICAL DATA:  Neurostimulator placement.  EXAM: THORACIC SPINE 2 VIEWS; DG C-ARM 1-60 MIN  COMPARISON:  08/20/2019 MRI  FINDINGS: Multiple C-arm images show placement of thoracic dorsal epidural neuro stimulators in the dorsal midline. One lead extends from lower T7 to mid T9. The other lead extends from mid T8 to upper T10.  IMPRESSION: Thoracic neuro stimulators in the dorsal thoracic midline in the region from lower T7 to upper T10.  Electronically Signed   By: Nelson Chimes M.D.   On: 09/06/2019 11:03 DG Thoracic Spine 2 View CLINICAL DATA:  Neurostimulator placement.  EXAM: THORACIC SPINE 2 VIEWS; DG C-ARM 1-60 MIN  COMPARISON:  08/20/2019 MRI  FINDINGS: Multiple C-arm images show placement of thoracic dorsal epidural neuro stimulators in the dorsal midline. One lead extends from lower T7 to mid T9. The other lead extends from mid T8 to upper T10.  IMPRESSION: Thoracic neuro stimulators in the dorsal thoracic midline in the region from lower T7 to upper T10.  Electronically Signed   By: Nelson Chimes M.D.   On: 09/06/2019 11:03 Note: Reviewed        Physical Exam  General appearance: Well nourished, well developed, and well hydrated. In no apparent acute distress Mental status: Alert, oriented x 3 (person, place, & time)       Respiratory: No evidence of acute respiratory distress Eyes: PERLA Vitals: BP 133/84   Pulse 80   Temp (!) 97.2 F (36.2 C) (Temporal)   Resp 16   Ht _0  (1.778 m)   Wt 198 lb (89.8 kg)   SpO2 99%   BMI 28.41 kg/m  BMI: Estimated body mass index is 28.41 kg/m as calculated from the following:   Height as of this encounter: _1  (1.778 m).   Weight as of this encounter: 198 lb (89.8 kg). Ideal: Ideal body weight: 73 kg (160 lb 15 oz) Adjusted ideal body weight: 79.7 kg (175 lb 12.2 oz)    Assessment   Status Diagnosis  Persistent Persistent Persistent 1. Lumbar  post-laminectomy syndrome   2. Failed back surgical syndrome   3. S/P lumbar microdiscectomy   4. Lumbar spondylosis   5. Musculoskeletal pain   6. Chronic pain syndrome       Plan of Care  Maxwell [name] has a current medication list which includes the following long-term medication(s): tadalafil.  Pharmacotherapy (Medications Ordered): Meds ordered this encounter  Medications  . methocarbamol (ROBAXIN) 750 MG tablet    Sig: Take 1.5 tablets (1,125 mg total) by mouth every 8 (eight) hours as needed for muscle spasms.    Dispense:  90 tablet    Refill:  5    Do not place this medication, or any other prescription from our practice, on "Automatic Refill". Patient may have prescription filled one day early if pharmacy is closed on scheduled refill date.   Follow-up plan:   Return if symptoms worsen or fail to improve.     No benefit with gabapentin, Lyrica, Cymbalta.  Robaxin is helpful.  Not interested in any interventional therapies, failed spinal cord stimulator trial 09/06/2019.       Recent Visits No visits were found meeting these conditions. Showing recent visits within past 90 days and meeting all other requirements Today's Visits Date Type Provider Dept  02/29/20  Office Visit Gillis Santa, MD [facility]  Showing today's visits and meeting all other requirements Future Appointments No visits were found meeting these conditions. Showing future appointments within next 90 days and meeting all other requirements  I discussed the assessment and treatment plan with the patient. The patient was provided an opportunity to ask questions and all were answered. The patient agreed with the plan and demonstrated an understanding of the instructions.  Patient advised to call back or seek an in-person evaluation if the symptoms or condition worsens.  Duration of encounter: 20 minutes.  Note by: Gillis Santa, MD Date: 02/29/2020; Time: 12:03 PM

## 2020-02-29 NOTE — Progress Notes (Signed)
Safety precautions to be maintained throughout the outpatient stay will include: orient to surroundings, keep bed in low position, maintain call bell within reach at all times, provide assistance with transfer out of bed and ambulation.  

## 2020-03-02 ENCOUNTER — Encounter: Payer: BC Managed Care – PPO | Admitting: Student in an Organized Health Care Education/Training Program

## 2020-03-09 DIAGNOSIS — G894 Chronic pain syndrome: Secondary | ICD-10-CM | POA: Diagnosis not present

## 2020-04-12 ENCOUNTER — Encounter: Payer: BC Managed Care – PPO | Admitting: Family Medicine

## 2020-04-13 ENCOUNTER — Ambulatory Visit: Payer: BC Managed Care – PPO | Admitting: Family Medicine

## 2020-04-13 ENCOUNTER — Encounter: Payer: Self-pay | Admitting: Family Medicine

## 2020-04-13 ENCOUNTER — Other Ambulatory Visit: Payer: Self-pay

## 2020-04-13 VITALS — BP 150/90 | HR 92 | Ht 70.0 in | Wt 190.0 lb

## 2020-04-13 DIAGNOSIS — N529 Male erectile dysfunction, unspecified: Secondary | ICD-10-CM

## 2020-04-13 MED ORDER — TADALAFIL 20 MG PO TABS: ORAL_TABLET | ORAL | 4 refills | Status: DC

## 2020-04-13 MED ORDER — TADALAFIL 20 MG PO TABS: ORAL_TABLET | ORAL | 1 refills | Status: DC

## 2020-04-13 NOTE — Progress Notes (Signed)
Date:  04/13/2020   Name:  Maxwell [name]   DOB:  05-13-1965   MRN:  [id number]   Chief Complaint: Erectile Dysfunction  Erectile Dysfunction This is a chronic problem. The problem has been gradually improving since onset. The nature of his difficulty is achieving erection. He reports no anxiety, decreased libido or performance anxiety. He reports his erection duration to be more than 10 minutes. Irritative symptoms do not include frequency, nocturia or urgency. Obstructive symptoms do not include dribbling, incomplete emptying, an intermittent stream, a slower stream, straining or a weak stream. Pertinent negatives include no chills, dysuria, genital pain, hematuria, hesitancy or inability to urinate. Past treatments include tadalafil. The treatment provided moderate relief.    Lab Results  Component Value Date   CREATININE 1.37 (H) 08/30/2019   BUN 15 08/30/2019   NA 137 08/30/2019   K 3.5 08/30/2019   CL 104 08/30/2019   CO2 27 08/30/2019   No results found for: CHOL, HDL, LDLCALC, LDLDIRECT, TRIG, CHOLHDL No results found for: TSH No results found for: HGBA1C Lab Results  Component Value Date   WBC 10.5 08/30/2019   HGB 14.8 08/30/2019   HCT 44.4 08/30/2019   MCV 94.9 08/30/2019   PLT 329 08/30/2019   No results found for: ALT, AST, GGT, ALKPHOS, BILITOT   Review of Systems  Constitutional: Negative for chills and fever.  HENT: Negative for drooling, ear discharge, ear pain and sore throat.   Respiratory: Negative for cough, shortness of breath and wheezing.   Cardiovascular: Negative for chest pain, palpitations and leg swelling.  Gastrointestinal: Negative for abdominal pain, blood in stool, constipation, diarrhea and nausea.  Endocrine: Negative for polydipsia.  Genitourinary: Negative for decreased libido, dysuria, frequency, hematuria, hesitancy, incomplete emptying, nocturia and urgency.  Musculoskeletal: Negative for back pain, myalgias and neck pain.  Skin:  Negative for rash.  Allergic/Immunologic: Negative for environmental allergies.  Neurological: Negative for dizziness and headaches.  Hematological: Does not bruise/bleed easily.  Psychiatric/Behavioral: Negative for suicidal ideas. The patient is not nervous/anxious.     Patient Active Problem List   Diagnosis Date Noted  . S/P lumbar microdiscectomy 11/19/2018  . Lumbar spondylosis 11/19/2018  . Chronic pain syndrome 10/08/2018  . Failed back surgical syndrome 10/08/2018  . Lumbar post-laminectomy syndrome 10/08/2018    No Known Allergies  Past Surgical History:  Procedure Laterality Date  . LUMBAR LAMINECTOMY/DECOMPRESSION MICRODISCECTOMY Right 05/28/2017   Procedure: LUMBAR LAMINECTOMY/DECOMPRESSION MICRODISCECTOMY 1 LEVEL-L4-5;  Surgeon: Venetia Night, MD;  Location: [facility];  Service: Neurosurgery;  Laterality: Right;  . SPINAL CORD STIMULATOR TRIAL Bilateral 09/06/2019   Procedure: THORACIC SPINAL CORD STIMULATOR PERCUTANEOUS TRIAL;  Surgeon: Lucy Chris, MD;  Location: [facility];  Service: Neurosurgery;  Laterality: Bilateral;    Social History   Tobacco Use  . Smoking status: Current Some Day Smoker    Types: Cigars  . Smokeless tobacco: Former Neurosurgeon    Types: Snuff  . Tobacco comment: counseled concerning meds and patches  Vaping Use  . Vaping Use: Never used  Substance Use Topics  . Alcohol use: Yes    Alcohol/week: 2.0 standard drinks    Types: 2 Cans of beer per week  . Drug use: No     Medication list has been reviewed and updated.  Current Meds  Medication Sig  . methocarbamol (ROBAXIN) 750 MG tablet Take 1.5 tablets (1,125 mg total) by mouth every 8 (eight) hours as needed for muscle spasms.  . Multiple Vitamins-Minerals (MULTIVITAMIN PO)  Take 10 tablets by mouth daily.   . Pseudoeph-Doxylamine-DM-APAP (NYQUIL PO) Take 1 Dose by mouth at bedtime as needed (sleep).  . tadalafil (CIALIS) 20 MG tablet TAKE 1 TABLET DAILY AS NEEDED FOR ERECTILE  DYSFUNCTION    PHQ 2/9 Scores 04/13/2020 02/29/2020 10/14/2018 05/13/2017  PHQ - 2 Score 0 0 0 0  PHQ- 9 Score 0 - - 2    GAD 7 : Generalized Anxiety Score 04/13/2020  Nervous, Anxious, on Edge 0  Control/stop worrying 0  Worry too much - different things 0  Trouble relaxing 0  Restless 0  Easily annoyed or irritable 0  Afraid - awful might happen 0  Total GAD 7 Score 0    BP Readings from Last 3 Encounters:  04/13/20 (!) 150/90  02/29/20 133/84  09/06/19 (!) 128/88    Physical Exam  Wt Readings from Last 3 Encounters:  04/13/20 190 lb (86.2 kg)  02/29/20 198 lb (89.8 kg)  08/30/19 190 lb (86.2 kg)    BP (!) 150/90   Pulse 92   Ht 5\' 10"  (1.778 m)   Wt 190 lb (86.2 kg)   BMI 27.26 kg/m   Assessment and Plan: 1. Vasculogenic erectile dysfunction, unspecified vasculogenic erectile dysfunction type Chronic.  Controlled.  Patient does well on Cialis 20 mg on an as-needed basis and this was sent in for 3 [facility] - tadalafil (CIALIS) 20 MG tablet; One prn  Dispense: 54 tablet; Refill: 1

## 2020-04-18 ENCOUNTER — Telehealth: Payer: Self-pay

## 2020-04-18 ENCOUNTER — Other Ambulatory Visit: Payer: Self-pay

## 2020-04-18 DIAGNOSIS — N529 Male erectile dysfunction, unspecified: Secondary | ICD-10-CM

## 2020-04-18 MED ORDER — TADALAFIL 20 MG PO TABS: ORAL_TABLET | ORAL | 1 refills | Status: DC

## 2020-04-18 NOTE — Telephone Encounter (Signed)
[facility]

## 2020-04-18 NOTE — Telephone Encounter (Signed)
Copied from CRM [id number]. Topic: General - Other >> Apr 18, 2020  3:32 PM Marylen Ponto wrote: Reason for CRM: Pt requested to speak with Delice Bison. Pt stated his pharmacy changed to [facility] and all prescriptions should be sent there. Pt requests call back from [place]

## 2020-04-24 ENCOUNTER — Telehealth: Payer: Self-pay

## 2020-04-24 NOTE — Telephone Encounter (Signed)
Called pt and left VM per Dr Yetta Barre to see if patient is still having finger/ hand pain. If so Dr. Jerolyn Center wants to see patient today any time he is available - and order a hand XR as well before patient comes in to see him.  Waiting for patient to call back to order XR and schedule appt with Dr Jerolyn Center.  Mariel Sleet, CMA (AAMA)

## 2020-06-08 DIAGNOSIS — G894 Chronic pain syndrome: Secondary | ICD-10-CM | POA: Diagnosis not present

## 2020-09-07 DIAGNOSIS — G894 Chronic pain syndrome: Secondary | ICD-10-CM | POA: Diagnosis not present

## 2020-10-02 ENCOUNTER — Other Ambulatory Visit: Payer: Self-pay | Admitting: Student in an Organized Health Care Education/Training Program

## 2020-10-02 DIAGNOSIS — G894 Chronic pain syndrome: Secondary | ICD-10-CM

## 2020-10-02 DIAGNOSIS — M961 Postlaminectomy syndrome, not elsewhere classified: Secondary | ICD-10-CM

## 2020-10-17 ENCOUNTER — Ambulatory Visit: Payer: BC Managed Care – PPO | Admitting: Family Medicine

## 2020-10-19 ENCOUNTER — Ambulatory Visit: Payer: BC Managed Care – PPO | Admitting: Family Medicine

## 2020-10-26 ENCOUNTER — Ambulatory Visit: Payer: BC Managed Care – PPO | Admitting: Family Medicine

## 2020-12-14 DIAGNOSIS — M961 Postlaminectomy syndrome, not elsewhere classified: Secondary | ICD-10-CM | POA: Diagnosis not present

## 2020-12-14 DIAGNOSIS — G894 Chronic pain syndrome: Secondary | ICD-10-CM | POA: Diagnosis not present

## 2021-07-16 IMAGING — RF DG C-ARM 1-60 MIN
1 series · 7 of 7 positions shown · non-contrast
Comparison: 08/20/2019 MRI

CLINICAL DATA: Neurostimulator placement.

EXAM:
THORACIC SPINE 2 VIEWS; DG C-ARM 1-60 MIN

[Series 1: dg x-ray · 0.20mm/px · 7 of 7 slices shown]
[im 1/7]
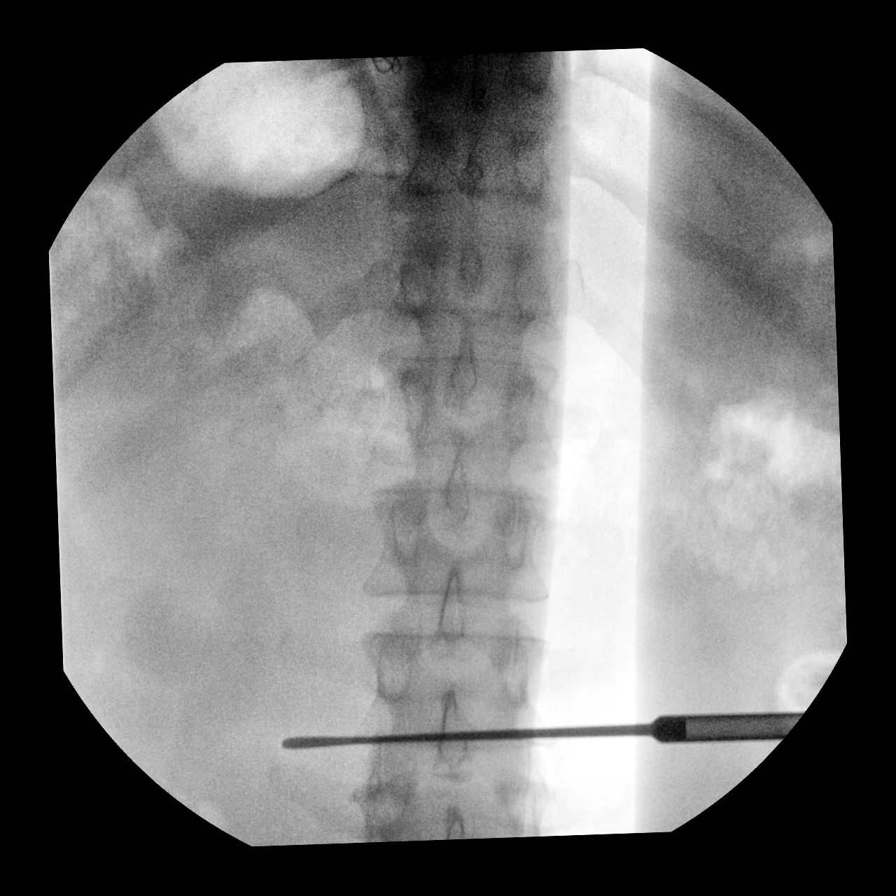
[im 2/7]
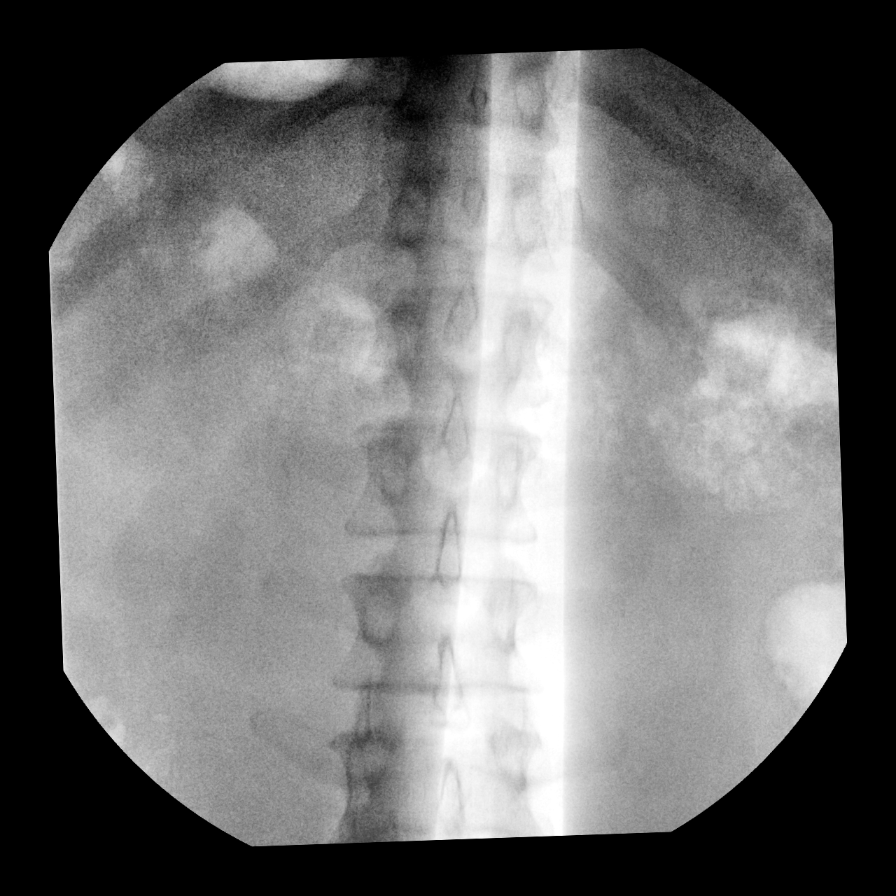
[im 3/7]
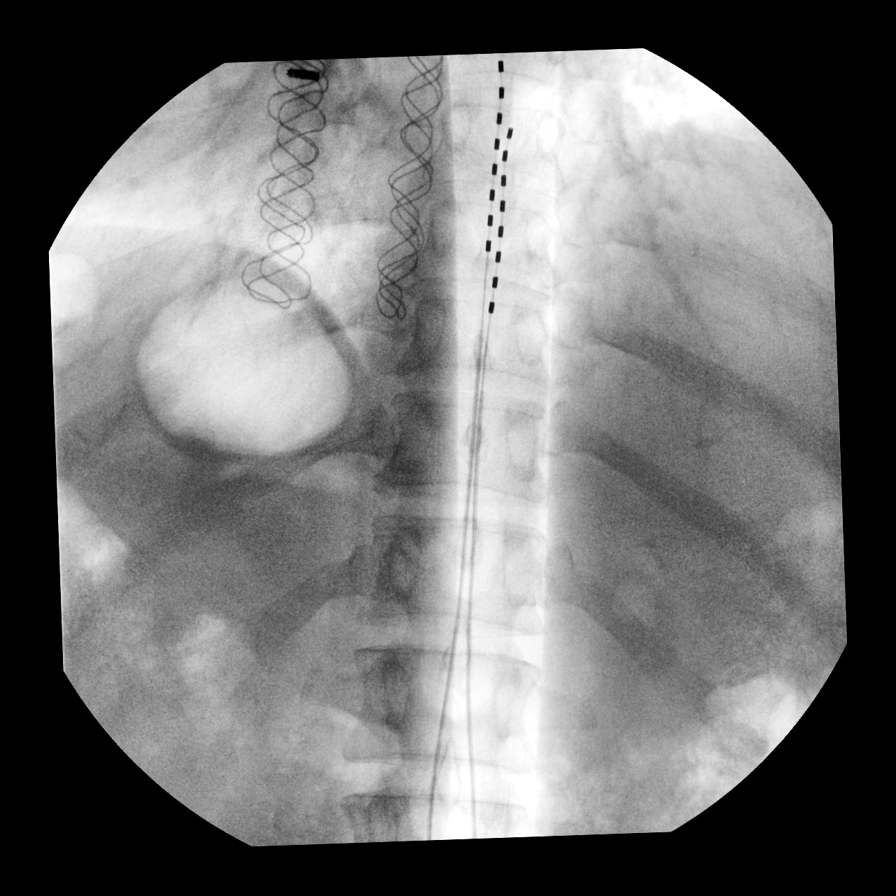
[im 4/7]
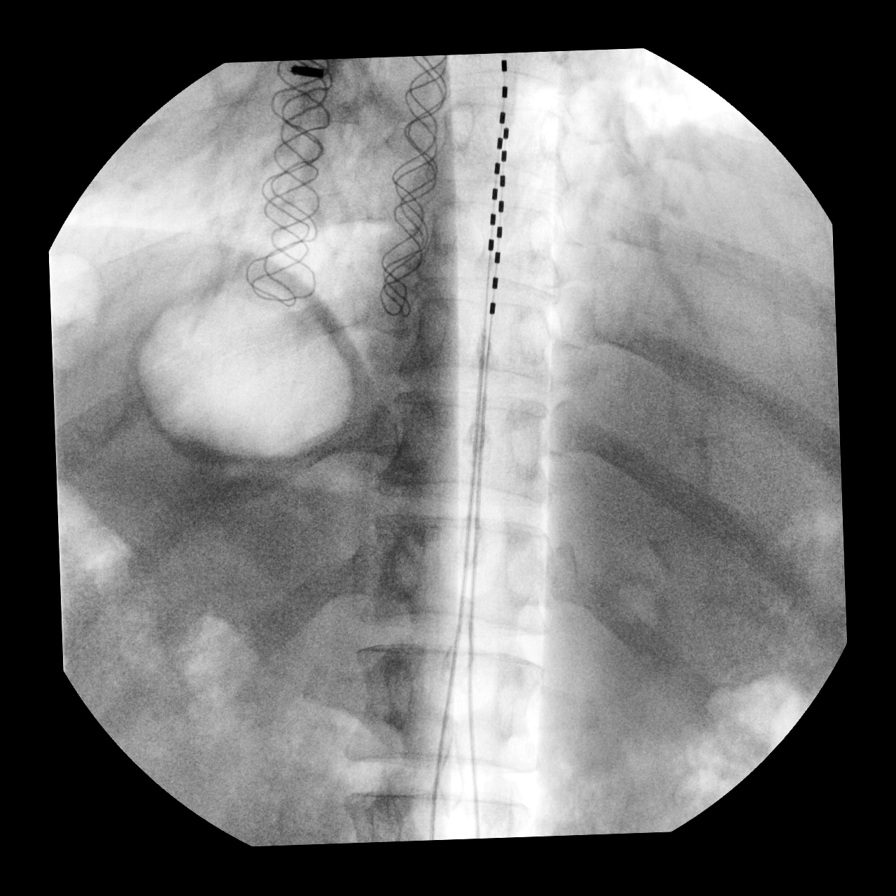
[im 5/7]
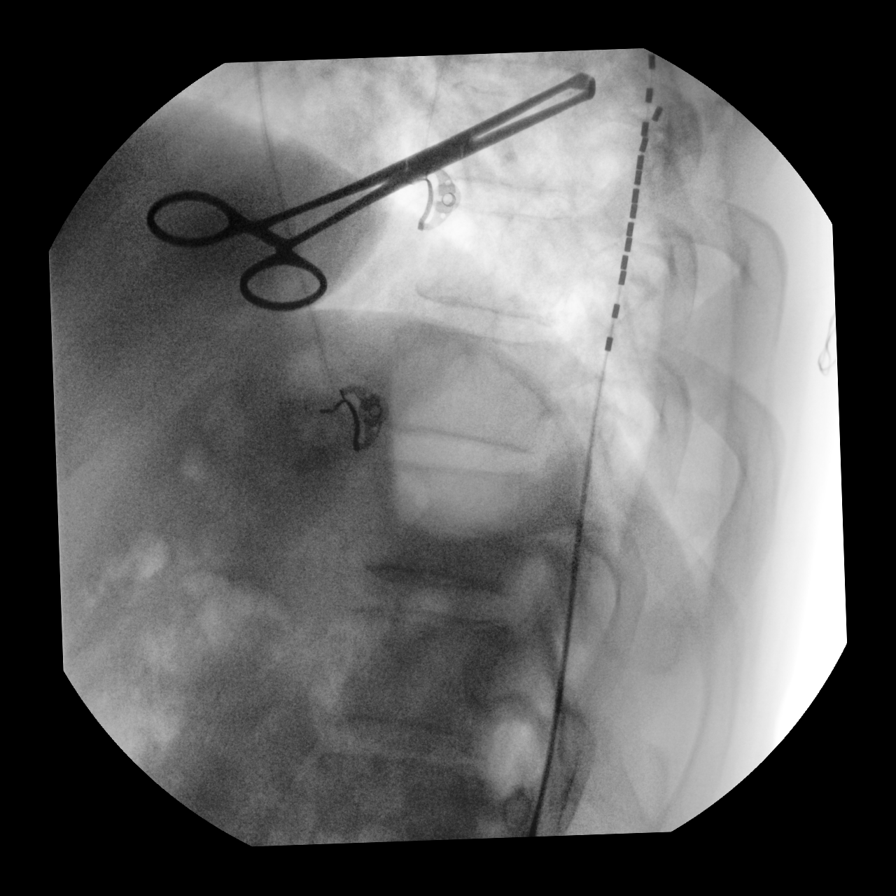
[im 6/7]
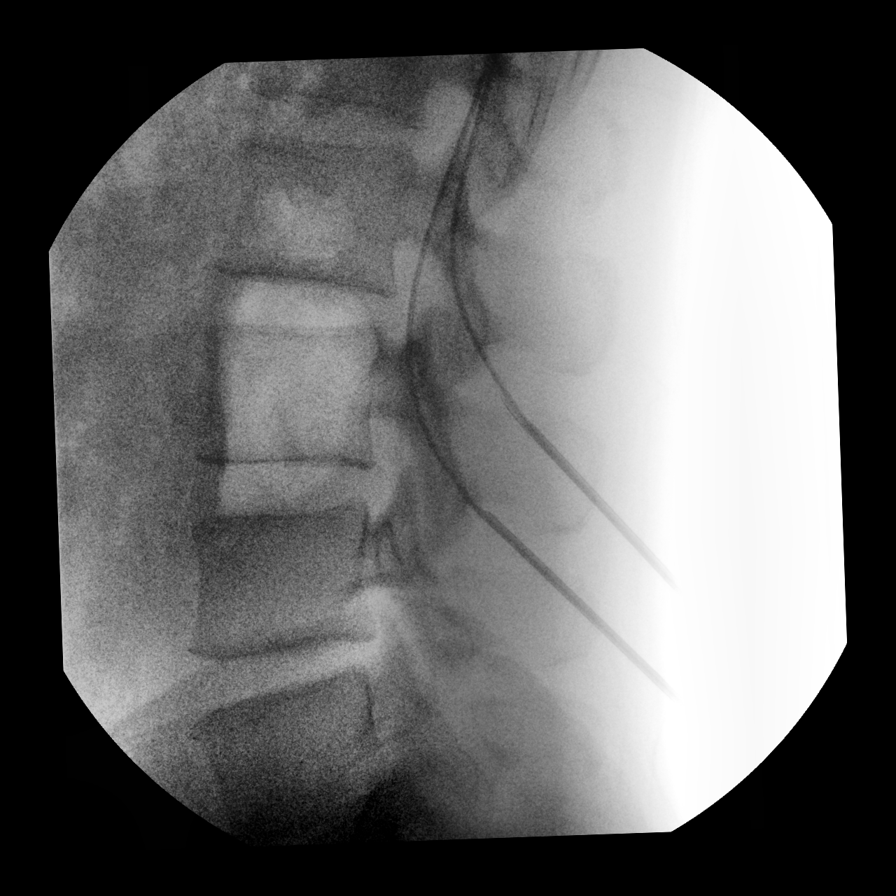
[im 7/7]
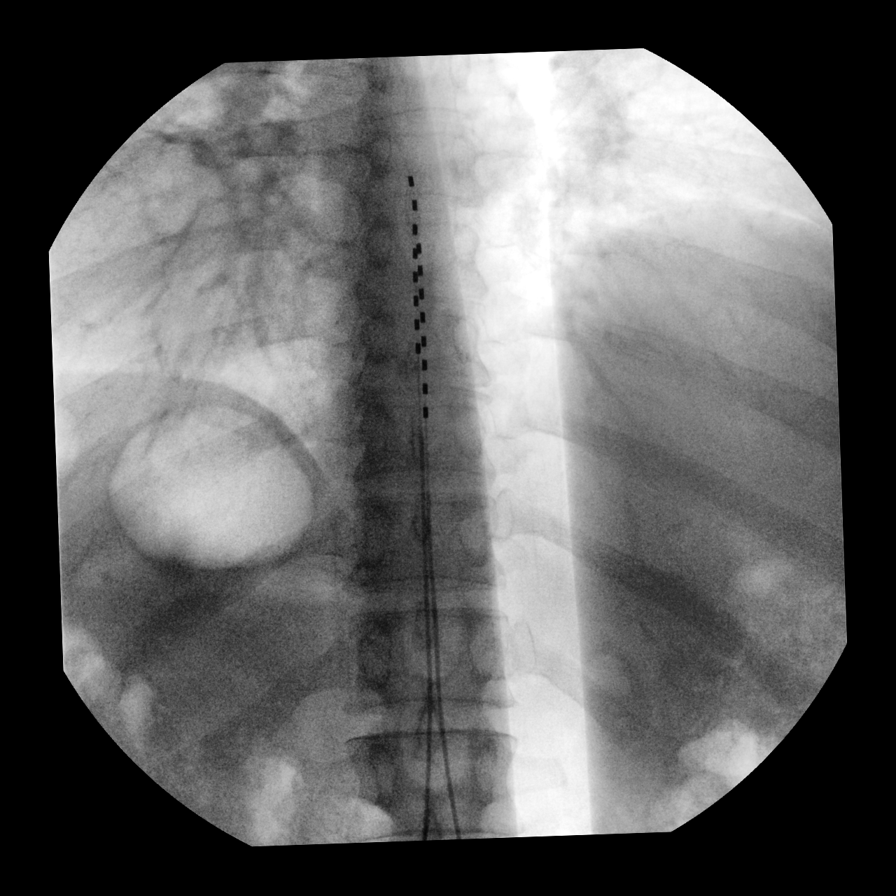

[7 of 7 positions shown; findings below may reference images not displayed]

FINDINGS: Multiple C-arm images show placement of thoracic dorsal epidural
neuro stimulators in the dorsal midline. One lead extends from lower
T7 to mid T9. The other lead extends from mid T8 to upper T10.
IMPRESSION: Thoracic neuro stimulators in the dorsal thoracic midline in the
region from lower T7 to upper T10.

## 2021-07-16 IMAGING — RF DG THORACIC SPINE 2V
1 series · 7 of 7 positions shown · non-contrast
Comparison: 08/20/2019 MRI

CLINICAL DATA: Neurostimulator placement.

EXAM:
THORACIC SPINE 2 VIEWS; DG C-ARM 1-60 MIN

[Series 1: dg x-ray · 0.20mm/px · 7 of 7 slices shown]
[im 1/7]
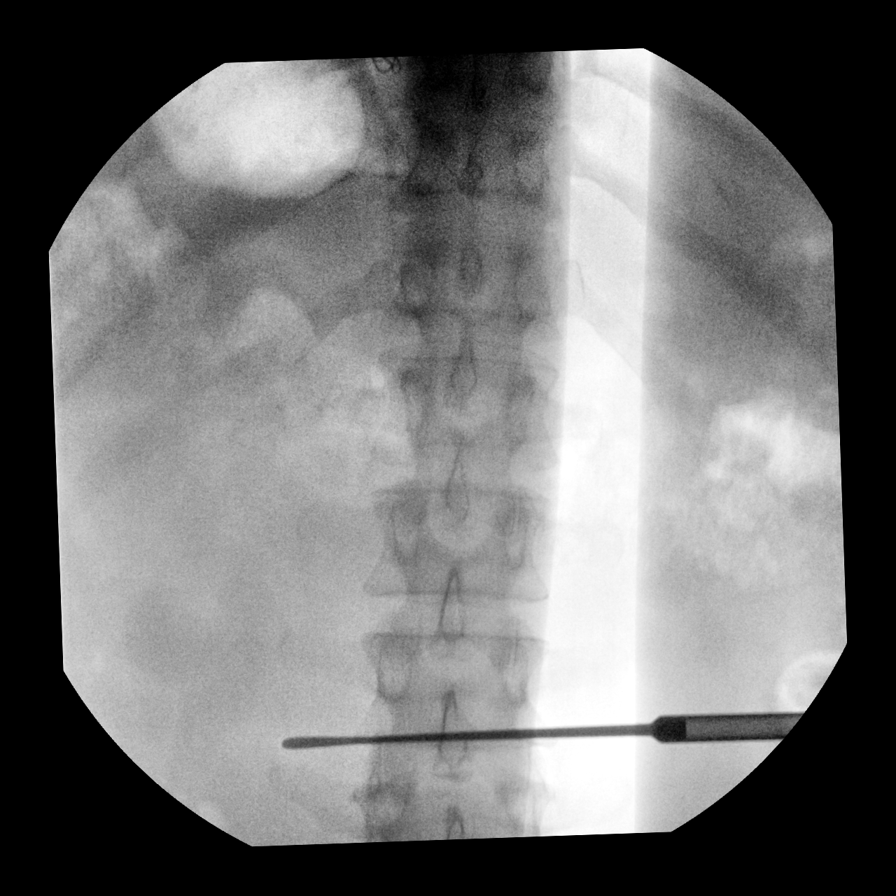
[im 2/7]
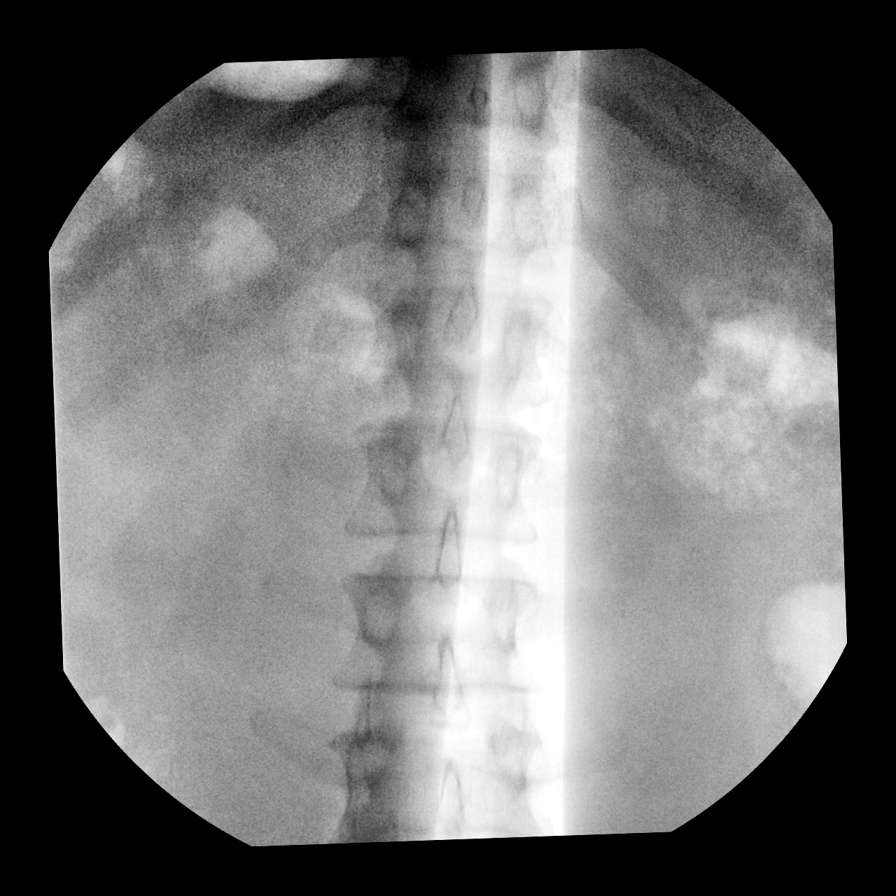
[im 3/7]
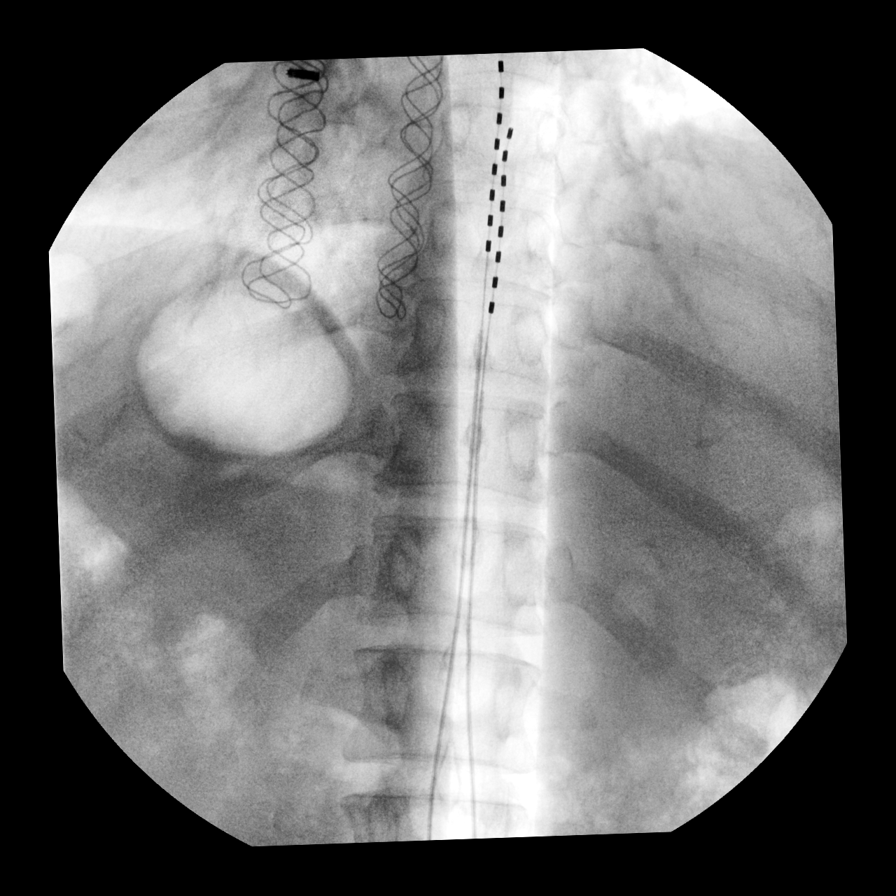
[im 4/7]
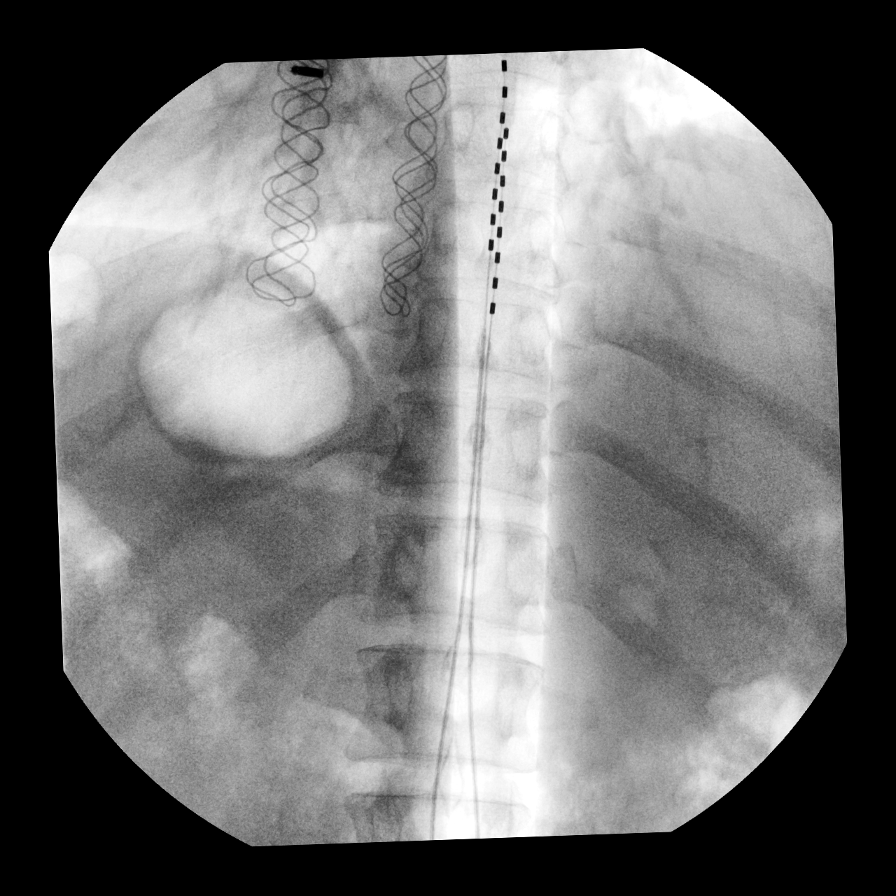
[im 5/7]
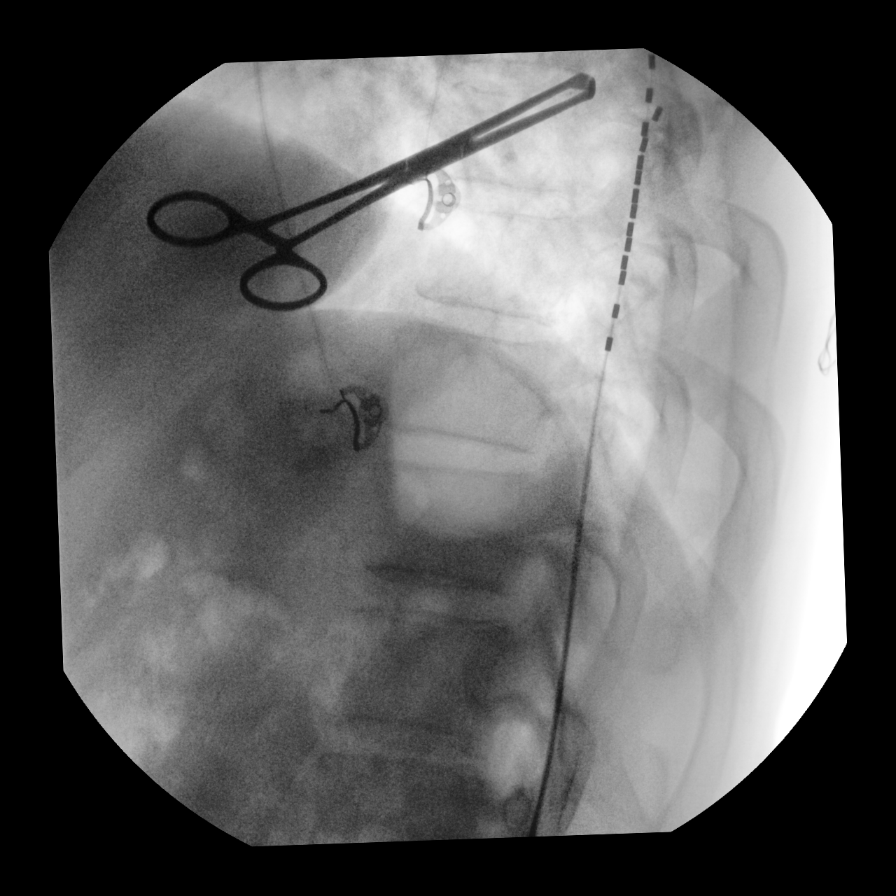
[im 6/7]
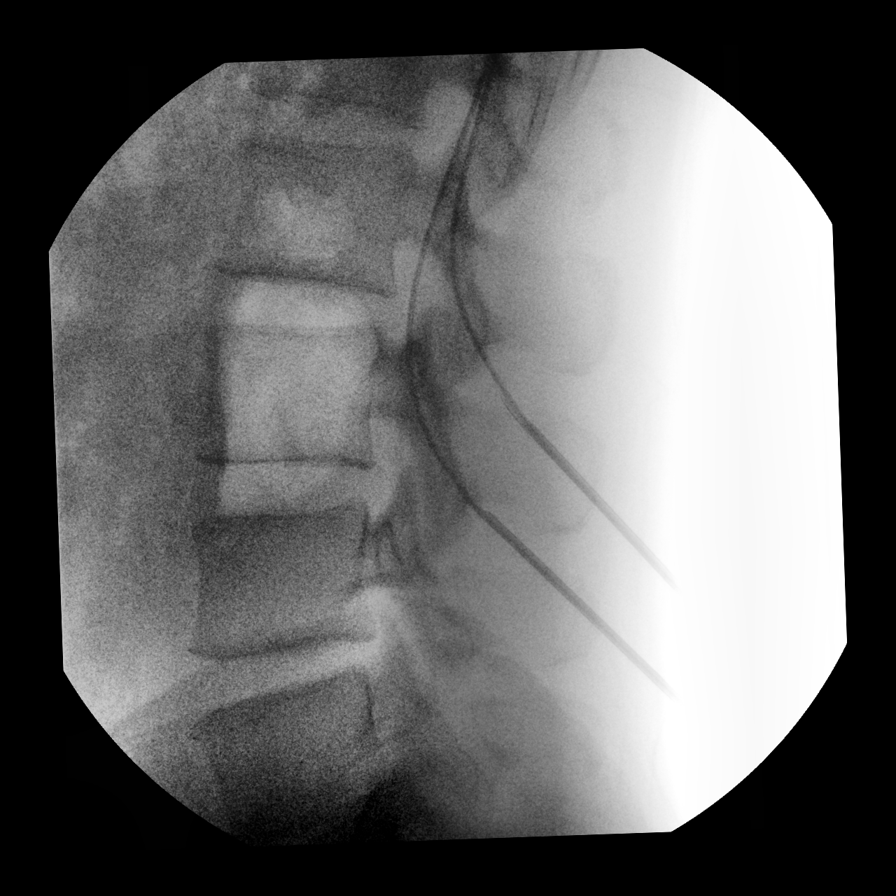
[im 7/7]
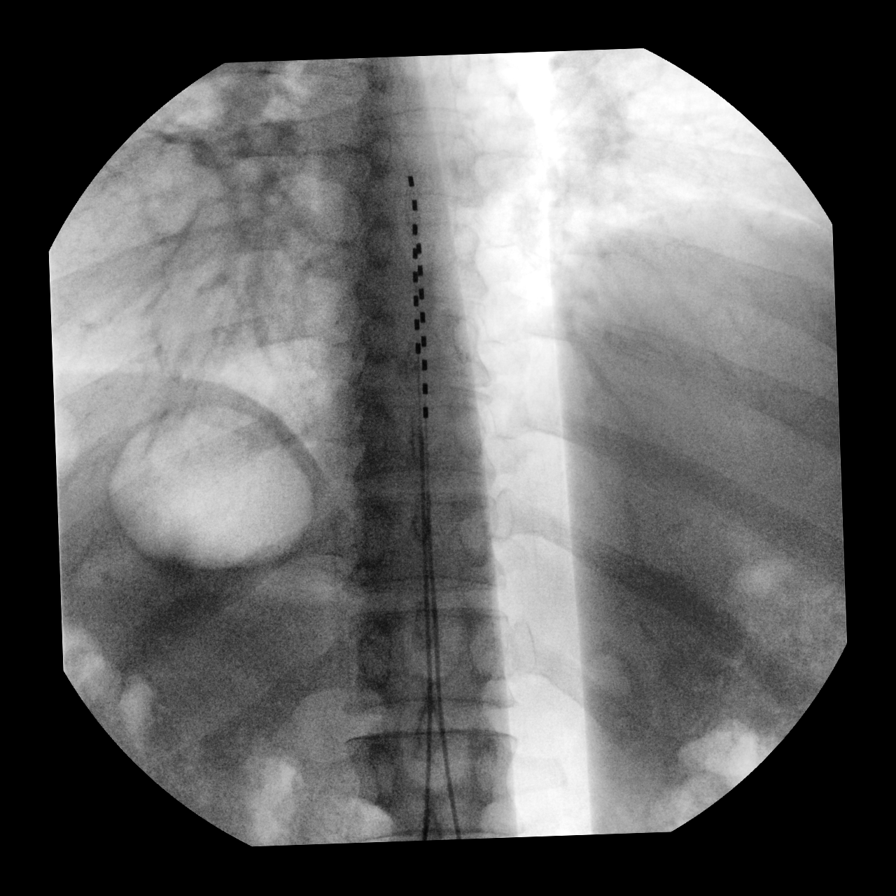

[7 of 7 positions shown; findings below may reference images not displayed]

FINDINGS: Multiple C-arm images show placement of thoracic dorsal epidural
neuro stimulators in the dorsal midline. One lead extends from lower
T7 to mid T9. The other lead extends from mid T8 to upper T10.
IMPRESSION: Thoracic neuro stimulators in the dorsal thoracic midline in the
region from lower T7 to upper T10.

## 2021-07-27 ENCOUNTER — Other Ambulatory Visit: Payer: Self-pay | Admitting: Family Medicine

## 2021-07-27 DIAGNOSIS — N529 Male erectile dysfunction, unspecified: Secondary | ICD-10-CM

## 2021-07-27 NOTE — Telephone Encounter (Signed)
Called patient to schedule future visit for medication refills. No answer, LVMTCB

## 2021-07-27 NOTE — Telephone Encounter (Signed)
Requested medication (s) are due for refill today: yes  Requested medication (s) are on the active medication list: yes  Last refill:  04/18/21 #54 1 refill  Future visit scheduled: yes in 3 days  Notes to clinic:  due for courtesy refill. Do you want to allow for more than 5 days refill per protocol or allow for #30 or #90 day supply due to mail order pharmacy?     Requested Prescriptions  Pending Prescriptions Disp Refills   tadalafil (CIALIS) 20 MG tablet [Pharmacy Med Name: TADALAFIL 20MG  A1 TABLET] 18 tablet     Sig: TAKE 1 TABLET BY MOUTH AS  NEEDED. MANUFACTURER  RECOMMENDS NOT TO EXCEED  20MG  PER DAY     Urology: Erectile Dysfunction Agents Failed - 07/27/2021 11:23 AM      Failed - AST in normal range and within 360 days    No results found for: "POCAST", "AST"       Failed - ALT in normal range and within 360 days    No results found for: "ALT", "LABALT", "POCALT"       Failed - Last BP in normal range    BP Readings from Last 1 Encounters:  04/13/20 (!) 150/90         Failed - Valid encounter within last 12 months    Recent Outpatient Visits           1 year ago Vasculogenic erectile dysfunction, unspecified vasculogenic erectile dysfunction type   [facility] 06/13/20, MD   2 years ago Colon cancer screening   [facility] Duanne Limerick, MD   3 years ago Primary insomnia   [facility], MD   3 years ago Vasculogenic erectile dysfunction, unspecified vasculogenic erectile dysfunction type   [facility] Duanne Limerick, MD   4 years ago Chest pain at rest   [facility] Duanne Limerick, MD       Future Appointments             In 3 days [facility], MD [facility], [facility]

## 2021-07-27 NOTE — Telephone Encounter (Signed)
Requested medication (s) are due for refill today: expired medication  Requested medication (s) are on the active medication list: yes  Last refill:  04/18/20 #54 1 refills  Future visit scheduled: no  Notes to clinic:  expired medication. Called pt to schedule appt med. Refills. No answer LVMTCB. Do you want to renew Rx?     Requested Prescriptions  Pending Prescriptions Disp Refills   tadalafil (CIALIS) 20 MG tablet [Pharmacy Med Name: TADALAFIL 20MG  A1 TABLET] 18 tablet     Sig: TAKE 1 TABLET BY MOUTH AS  NEEDED. MANUFACTURER  RECOMMENDS NOT TO EXCEED  20MG  PER DAY     Urology: Erectile Dysfunction Agents Failed - 07/27/2021  4:32 AM      Failed - AST in normal range and within 360 days    No results found for: "POCAST", "AST"       Failed - ALT in normal range and within 360 days    No results found for: "ALT", "LABALT", "POCALT"       Failed - Last BP in normal range    BP Readings from Last 1 Encounters:  04/13/20 (!) 150/90         Failed - Valid encounter within last 12 months    Recent Outpatient Visits           1 year ago Vasculogenic erectile dysfunction, unspecified vasculogenic erectile dysfunction type   [facility] 06/13/20, MD   2 years ago Colon cancer screening   [facility] Duanne Limerick, MD   3 years ago Primary insomnia   [facility], MD   3 years ago Vasculogenic erectile dysfunction, unspecified vasculogenic erectile dysfunction type   [facility] Duanne Limerick, MD   4 years ago Chest pain at rest   [facility] Duanne Limerick, MD

## 2021-07-30 ENCOUNTER — Ambulatory Visit: Payer: BC Managed Care – PPO | Admitting: Family Medicine

## 2021-07-30 ENCOUNTER — Other Ambulatory Visit: Payer: Self-pay

## 2021-07-30 ENCOUNTER — Other Ambulatory Visit: Payer: Self-pay | Admitting: Family Medicine

## 2021-07-30 ENCOUNTER — Encounter: Payer: Self-pay | Admitting: Family Medicine

## 2021-07-30 VITALS — BP 102/60 | HR 68 | Ht 70.0 in | Wt 170.0 lb

## 2021-07-30 DIAGNOSIS — Z1211 Encounter for screening for malignant neoplasm of colon: Secondary | ICD-10-CM | POA: Diagnosis not present

## 2021-07-30 DIAGNOSIS — N529 Male erectile dysfunction, unspecified: Secondary | ICD-10-CM | POA: Diagnosis not present

## 2021-07-30 MED ORDER — NA SULFATE-K SULFATE-MG SULF 17.5-3.13-1.6 GM/177ML PO SOLN: 1.0000 | Freq: Once | ORAL | 0 refills | Status: AC

## 2021-07-30 MED ORDER — TADALAFIL 20 MG PO TABS: ORAL_TABLET | ORAL | 3 refills | Status: DC

## 2021-07-30 NOTE — Progress Notes (Signed)
Gastroenterology Pre-Procedure Review  Request Date: 09/07/2021 Requesting Physician: Dr. Tobi Bastos   PATIENT REVIEW QUESTIONS: The patient responded to the following health history questions as indicated:    1. Are you having any GI issues? no 2. Do you have a personal history of Polyps? no 3. Do you have a family history of Colon Cancer or Polyps? no 4. Diabetes Mellitus? no 5. Joint replacements in the past 12 months?no 6. Major health problems in the past 3 months?no 7. Any artificial heart valves, MVP, or defibrillator?no    MEDICATIONS & ALLERGIES:    Patient reports the following regarding taking any anticoagulation/antiplatelet therapy:   Plavix, Coumadin, Eliquis, Xarelto, Lovenox, Pradaxa, Brilinta, or Effient? no Aspirin? no  Patient confirms/reports the following medications:  Current Outpatient Medications  Medication Sig Dispense Refill   methocarbamol (ROBAXIN) 750 MG tablet Take 1.5 tablets (1,125 mg total) by mouth every 8 (eight) hours as needed for muscle spasms. 90 tablet 5   Multiple Vitamins-Minerals (MULTIVITAMIN PO) Take 10 tablets by mouth daily.      Pseudoeph-Doxylamine-DM-APAP (NYQUIL PO) Take 1 Dose by mouth at bedtime as needed (sleep).     tadalafil (CIALIS) 20 MG tablet One prn 54 tablet 3   No current facility-administered medications for this visit.    Patient confirms/reports the following allergies:  No Known Allergies  No orders of the defined types were placed in this encounter.   AUTHORIZATION INFORMATION Primary Insurance: 1D#: Group #:  Secondary Insurance: 1D#: Group #:  SCHEDULE INFORMATION: Date: 09/07/2021 Time: Location:[facility]

## 2021-07-30 NOTE — Progress Notes (Signed)
Date:  07/30/2021   Name:  Maxwell [name]   DOB:  [birth year]   MRN:  [id number]   Chief Complaint: Erectile Dysfunction  Erectile Dysfunction This is a chronic problem. The current episode started more than 1 year ago. The problem has been gradually improving since onset. The nature of his difficulty is achieving erection. He reports no anxiety. Irritative symptoms do not include frequency or urgency. Pertinent negatives include no chills, dysuria, hematuria or hesitancy. Past treatments include sildenafil. The treatment provided moderate relief.    Lab Results  Component Value Date   NA 137 08/30/2019   K 3.5 08/30/2019   CO2 27 08/30/2019   GLUCOSE 118 (H) 08/30/2019   BUN 15 08/30/2019   CREATININE 1.37 (H) 08/30/2019   CALCIUM 9.1 08/30/2019   GFRNONAA 58 (L) 08/30/2019   No results found for: "CHOL", "HDL", "LDLCALC", "LDLDIRECT", "TRIG", "CHOLHDL" No results found for: "TSH" No results found for: "HGBA1C" Lab Results  Component Value Date   WBC 10.5 08/30/2019   HGB 14.8 08/30/2019   HCT 44.4 08/30/2019   MCV 94.9 08/30/2019   PLT 329 08/30/2019   No results found for: "ALT", "AST", "GGT", "ALKPHOS", "BILITOT" No results found for: "25OHVITD2", "25OHVITD3", "VD25OH"   Review of Systems  Constitutional:  Negative for chills and fever.  HENT:  Negative for drooling, ear discharge, ear pain and sore throat.   Respiratory:  Negative for cough, shortness of breath and wheezing.   Cardiovascular:  Negative for chest pain, palpitations and leg swelling.  Gastrointestinal:  Negative for abdominal pain, blood in stool, constipation, diarrhea and nausea.  Endocrine: Negative for polydipsia.  Genitourinary:  Negative for dysuria, frequency, hematuria, hesitancy and urgency.  Musculoskeletal:  Negative for back pain, myalgias and neck pain.  Skin:  Negative for rash.  Allergic/Immunologic: Negative for environmental allergies.  Neurological:  Negative for dizziness and  headaches.  Hematological:  Does not bruise/bleed easily.  Psychiatric/Behavioral:  Negative for suicidal ideas. The patient is not nervous/anxious.     Patient Active Problem List   Diagnosis Date Noted   S/P lumbar microdiscectomy 11/19/2018   Lumbar spondylosis 11/19/2018   Chronic pain syndrome 10/08/2018   Failed back surgical syndrome 10/08/2018   Lumbar post-laminectomy syndrome 10/08/2018    No Known Allergies  Past Surgical History:  Procedure Laterality Date   LUMBAR LAMINECTOMY/DECOMPRESSION MICRODISCECTOMY Right 05/28/2017   Procedure: LUMBAR LAMINECTOMY/DECOMPRESSION MICRODISCECTOMY 1 LEVEL-L4-5;  Surgeon: Venetia Night, MD;  Location: [facility];  Service: Neurosurgery;  Laterality: Right;   SPINAL CORD STIMULATOR TRIAL Bilateral 09/06/2019   Procedure: THORACIC SPINAL CORD STIMULATOR PERCUTANEOUS TRIAL;  Surgeon: Lucy Chris, MD;  Location: [facility];  Service: Neurosurgery;  Laterality: Bilateral;    Social History   Tobacco Use   Smoking status: Some Days    Types: Cigars   Smokeless tobacco: Former    Types: Snuff   Tobacco comments:    counseled concerning meds and patches  Vaping Use   Vaping Use: Never used  Substance Use Topics   Alcohol use: Yes    Alcohol/week: 2.0 standard drinks of alcohol    Types: 2 Cans of beer per week   Drug use: No     Medication list has been reviewed and updated.  Current Meds  Medication Sig   methocarbamol (ROBAXIN) 750 MG tablet Take 1.5 tablets (1,125 mg total) by mouth every 8 (eight) hours as needed for muscle spasms.   Multiple Vitamins-Minerals (MULTIVITAMIN PO) Take 10 tablets by mouth  daily.    Pseudoeph-Doxylamine-DM-APAP (NYQUIL PO) Take 1 Dose by mouth at bedtime as needed (sleep).   tadalafil (CIALIS) 20 MG tablet One prn       07/30/2021    9:50 AM 04/13/2020    4:18 PM  GAD 7 : Generalized Anxiety Score  Nervous, Anxious, on Edge 0 0  Control/stop worrying 0 0  Worry too much - different  things 0 0  Trouble relaxing 0 0  Restless 0 0  Easily annoyed or irritable 0 0  Afraid - awful might happen 0 0  Total GAD 7 Score 0 0  Anxiety Difficulty Not difficult at all        07/30/2021    9:50 AM  Depression screen PHQ 2/9  Decreased Interest 0  Down, Depressed, Hopeless 0  PHQ - 2 Score 0  Altered sleeping 0  Tired, decreased energy 0  Change in appetite 0  Feeling bad or failure about yourself  0  Trouble concentrating 0  Moving slowly or fidgety/restless 0  Suicidal thoughts 0  PHQ-9 Score 0  Difficult doing work/chores Not difficult at all    BP Readings from Last 3 Encounters:  07/30/21 102/60  04/13/20 (!) 150/90  02/29/20 133/84    Physical Exam Vitals and nursing note reviewed.  HENT:     Head: Normocephalic.     Right Ear: Tympanic membrane and external ear normal.     Left Ear: Tympanic membrane and external ear normal.     Nose: Nose normal. No congestion or rhinorrhea.  Eyes:     General: No scleral icterus.       Right eye: No discharge.        Left eye: No discharge.     Conjunctiva/sclera: Conjunctivae normal.     Pupils: Pupils are equal, round, and reactive to light.  Neck:     Thyroid: No thyromegaly.     Vascular: No JVD.     Trachea: No tracheal deviation.  Cardiovascular:     Rate and Rhythm: Normal rate and regular rhythm.     Heart sounds: Normal heart sounds, S1 normal and S2 normal. No murmur heard.    No systolic murmur is present.     No diastolic murmur is present.     No friction rub. No gallop. No S3 or S4 sounds.  Pulmonary:     Effort: No respiratory distress.     Breath sounds: Normal breath sounds. No wheezing, rhonchi or rales.  Abdominal:     General: Bowel sounds are normal.     Palpations: Abdomen is soft. There is no mass.     Tenderness: There is no abdominal tenderness. There is no guarding or rebound.  Musculoskeletal:        General: No tenderness. Normal range of motion.     Cervical back: Normal  range of motion and neck supple.  Lymphadenopathy:     Cervical: No cervical adenopathy.  Skin:    General: Skin is warm.     Findings: No rash.  Neurological:     Mental Status: He is alert and oriented to person, place, and time.     Cranial Nerves: No cranial nerve deficit.     Wt Readings from Last 3 Encounters:  07/30/21 170 lb (77.1 kg)  04/13/20 190 lb (86.2 kg)  02/29/20 198 lb (89.8 kg)    BP 102/60   Pulse 68   Ht 5\' 10"  (1.778 m)   Wt 170 lb (77.1 kg)  BMI 24.39 kg/m   Assessment and Plan:  1. Vasculogenic erectile dysfunction, unspecified vasculogenic erectile dysfunction type Chronic.  Controlled.  Stable.  Patient on as-needed basis is doing well with Cialis 20 mg. - tadalafil (CIALIS) 20 MG tablet; One prn  Dispense: 54 tablet; Refill: 3  2. Colon cancer screening Discussed with patient and referral made to gastroenterology. - Ambulatory referral to Gastroenterology

## 2021-07-31 ENCOUNTER — Telehealth: Payer: Self-pay

## 2021-07-31 NOTE — Telephone Encounter (Signed)
Patient LVM requesting call back in regards to colonoscopy questions.  I've lvm for him to call me back to address his concerns.  Thanks, [place], [place]

## 2021-07-31 NOTE — Telephone Encounter (Signed)
Requested medication (s) are due for refill today:   Prescribed yesterday  Requested medication (s) are on the active medication list:   Yes  Future visit scheduled:   Yes   Last ordered: 07/30/2021 #54, 3 refills  Returned because labs due per protocol   Requested Prescriptions  Pending Prescriptions Disp Refills   tadalafil (CIALIS) 20 MG tablet [Pharmacy Med Name: TADALAFIL 20MG  A1 TABLET] 18 tablet     Sig: TAKE 1 TABLET BY MOUTH AS  NEEDED. MANUFACTURER  RECOMMENDS NOT TO EXCEED  20MG  PER DAY     Urology: Erectile Dysfunction Agents Failed - 07/30/2021  5:01 PM      Failed - AST in normal range and within 360 days    No results found for: "POCAST", "AST"       Failed - ALT in normal range and within 360 days    No results found for: "ALT", "LABALT", "POCALT"       Passed - Last BP in normal range    BP Readings from Last 1 Encounters:  07/30/21 102/60         Passed - Valid encounter within last 12 months    Recent Outpatient Visits           Yesterday Vasculogenic erectile dysfunction, unspecified vasculogenic erectile dysfunction type   [facility] 08/01/21, MD   1 year ago Vasculogenic erectile dysfunction, unspecified vasculogenic erectile dysfunction type   [facility] Duanne Limerick, MD   2 years ago Colon cancer screening   [facility] Duanne Limerick, MD   3 years ago Primary insomnia   [facility], MD   3 years ago Vasculogenic erectile dysfunction, unspecified vasculogenic erectile dysfunction type   [facility] Duanne Limerick, MD       Future Appointments             In 6 months [facility], MD [facility]

## 2021-09-24 ENCOUNTER — Telehealth: Payer: Self-pay

## 2021-09-24 ENCOUNTER — Telehealth: Payer: Self-pay | Admitting: Gastroenterology

## 2021-09-24 NOTE — Telephone Encounter (Signed)
Pt called and cancelled colonoscopy will call back and resched

## 2021-09-24 NOTE — Telephone Encounter (Signed)
Patients call has been returned to reschedule his colonoscopy.  He did not want to reschedule at this time.  His procedure was scheduled with Dr. Servando Snare for tomorrow. Raynelle Fanning in Endo notified of cancellation.  Thanks, [place], [place]

## 2021-09-25 ENCOUNTER — Ambulatory Visit
Admission: RE | Admit: 2021-09-25 | Payer: BC Managed Care – PPO | Source: Ambulatory Visit | Admitting: Gastroenterology

## 2021-09-25 ENCOUNTER — Encounter: Admission: RE | Payer: Self-pay | Source: Ambulatory Visit

## 2021-09-25 SURGERY — COLONOSCOPY WITH PROPOFOL
Anesthesia: General

## 2022-01-17 NOTE — Progress Notes (Signed)
Referring Physician:  Duanne Limerick, MD [address] [place],  [place] [address]  Primary Physician:  Duanne Limerick, MD  History of Present Illness: 01/21/2022  DOS: 05/28/17 - R L4-5 microdiscectomy by Dr. Myer Haff DOS: 09/06/19 - SCS trial with percutaneous lead by Dr. Adriana Simas  [name] has a history of chronic pain syndrome and ED.   Last seen by Danielle on 03/02/21 and he was sent back to work.   He went back to work in January. He's had chronic lower back pain x years. Now with 2 month history of intermittent mid back pain that radiates to his lower back. No leg pain. Some radiation of pain into ribs on both sides. No numbness or tingling. No weakness in arms/legs. No specific aggravating factors. Some relief if he avoids lifting.   Minimal relief with robaxin and aleve.   Bowel/Bladder Dysfunction: none  Conservative measures:  Physical therapy: nothing recent  Multimodal medical therapy including regular antiinflammatories: robaxin, aleve Injections: no epidural steroid injections  Past Surgery:  05/28/17 - R L4-5 microdiscectomy by Dr. Myer Haff 09/06/19 - SCS trial with percutaneous lead by Dr. Michaelene Song has no symptoms of cervical myelopathy.  The symptoms are causing a significant impact on the patient's life.   Review of Systems:  A 10 point review of systems is negative, except for the pertinent positives and negatives detailed in the HPI.  Past Medical History: Past Medical History:  Diagnosis Date   Bulging lumbar disc    Chronic pain    low back   DDD (degenerative disc disease), lumbar    History of kidney stones     Past Surgical History: Past Surgical History:  Procedure Laterality Date   LUMBAR LAMINECTOMY/DECOMPRESSION MICRODISCECTOMY Right 05/28/2017   Procedure: LUMBAR LAMINECTOMY/DECOMPRESSION MICRODISCECTOMY 1 LEVEL-L4-5;  Surgeon: Venetia Night, MD;  Location: [facility] OR[facility];  Service: Neurosurgery;   Laterality: Right;   SPINAL CORD STIMULATOR TRIAL Bilateral 09/06/2019   Procedure: THORACIC SPINAL CORD STIMULATOR PERCUTANEOUS TRIAL;  Surgeon: Lucy Chris, MD;  Location: [facility];  Service: Neurosurgery;  Laterality: Bilateral;    Allergies: Allergies as of 01/21/2022   (No Known Allergies)    Medications: Outpatient Encounter Medications as of 01/21/2022  Medication Sig   Multiple Vitamins-Minerals (MULTIVITAMIN PO) Take 10 tablets by mouth daily.    Pseudoeph-Doxylamine-DM-APAP (NYQUIL PO) Take 1 Dose by mouth at bedtime as needed (sleep).   tadalafil (CIALIS) 20 MG tablet TAKE 1 TABLET BY MOUTH DAILY AS  NEEDED   [DISCONTINUED] methocarbamol (ROBAXIN) 750 MG tablet Take 1.5 tablets (1,125 mg total) by mouth every 8 (eight) hours as needed for muscle spasms. (Patient not taking: Reported on 01/21/2022)   No facility-administered encounter medications on file as of 01/21/2022.    Social History: Social History   Tobacco Use   Smoking status: Some Days    Types: Cigars   Smokeless tobacco: Former    Types: Snuff   Tobacco comments:    counseled concerning meds and patches  Vaping Use   Vaping Use: Never used  Substance Use Topics   Alcohol use: Yes    Alcohol/week: 2.0 standard drinks of alcohol    Types: 2 Cans of beer per week   Drug use: No    Family Medical History: Family History  Family history unknown: Yes    Physical Examination: Vitals:   01/21/22 1027  BP: 130/78    General: Patient is well developed, well nourished, calm, collected, and  in no apparent distress. Attention to examination is appropriate.  Respiratory: Patient is breathing without any difficulty.   NEUROLOGICAL:     Awake, alert, oriented to person, place, and time.  Speech is clear and fluent. Fund of knowledge is appropriate.   Cranial Nerves: Pupils equal round and reactive to light.  Facial tone is symmetric.   ROM of spine:  limited ROM of lumbar spine with pain on  flexion  He has mild mid thoracic tenderness. No scapular tenderness.   He has minimal lower lumbar tenderness. Incision is well healed.   No abnormal lesions on exposed skin.   Strength: Side Biceps Triceps Deltoid Interossei Grip Wrist Ext. Wrist Flex.  R 5 5 5 5 5 5 5   L 5 5 5 5 5 5 5    Side Iliopsoas Quads Hamstring PF DF EHL  R 5 5 5 5 5 5   L 5 5 5 5 5 5    Reflexes are 2+ and symmetric at the biceps, triceps, brachioradialis, patella and achilles.   Hoffman's is absent.  Clonus is not present.   Bilateral upper and lower extremity sensation is intact to light touch.     Gait is normal.     Medical Decision Making  Imaging: No recent spinal imaging.    Assessment and Plan: [name] is a pleasant 56 y.o. male with chronic lower back pain x years. Now with 2 month history of intermittent mid back pain that radiates to his lower back. No leg pain. Some radiation of pain into ribs on both sides. No numbness or tingling. No weakness in arms/legs.   No updated imaging noted.   Treatment options discussed with patient and following plan made:   - Thoracic xrays to be done today. Will message with results.  - PT for thoracic spine. Orders sent to [facility] in [place].  - Refill given on robaxin. Reviewed dosing and side effects. He knows it can make him sleepy.  - If no improvement with above, may consider thoracic MRI.   I spent a total of 30 minutes in face-to-face and non-face-to-face activities related to this patient's care toda including review of outside records, review of imaging, review of symptoms, physical exam, discussion of differential diagnosis, discussion of treatment options, and documentation.   Rosezetta Schlatter PA-C Dept. of Neurosurgery

## 2022-01-21 ENCOUNTER — Encounter: Payer: Self-pay | Admitting: Orthopedic Surgery

## 2022-01-21 ENCOUNTER — Ambulatory Visit
Admission: RE | Admit: 2022-01-21 | Discharge: 2022-01-21 | Disposition: A | Discharge status: Home / Self Care | Payer: BC Managed Care – PPO | Attending: Orthopedic Surgery | Admitting: Orthopedic Surgery

## 2022-01-21 ENCOUNTER — Ambulatory Visit
Admission: RE | Admit: 2022-01-21 | Discharge: 2022-01-21 | Disposition: A | Discharge status: Home / Self Care | Payer: BC Managed Care – PPO | Source: Ambulatory Visit | Attending: Orthopedic Surgery | Admitting: Orthopedic Surgery

## 2022-01-21 ENCOUNTER — Ambulatory Visit (INDEPENDENT_AMBULATORY_CARE_PROVIDER_SITE_OTHER): Payer: BC Managed Care – PPO | Admitting: Orthopedic Surgery

## 2022-01-21 VITALS — BP 130/78 | Ht 70.0 in | Wt 170.8 lb

## 2022-01-21 DIAGNOSIS — M546 Pain in thoracic spine: Secondary | ICD-10-CM | POA: Insufficient documentation

## 2022-01-21 DIAGNOSIS — M961 Postlaminectomy syndrome, not elsewhere classified: Secondary | ICD-10-CM

## 2022-01-21 DIAGNOSIS — Z9889 Other specified postprocedural states: Secondary | ICD-10-CM

## 2022-01-21 MED ORDER — METHOCARBAMOL 500 MG PO TABS: 500.0000 mg | ORAL_TABLET | Freq: Three times a day (TID) | ORAL | 0 refills | Status: DC | PRN

## 2022-01-21 NOTE — Patient Instructions (Signed)
It was so nice to see you today, I am sorry that you are hurting so much.   I want to get some xrays of your mid back. They are ordered and you can go across the street at [facility] to get them done. You do not need an appointment.   I sent physical therapy orders to [facility] in [place], they should call you to schedule. You can call them at [phone] if you don't hear from them.   I sent a prescription for methocarbamol to help with muscle spasms. Use only as needed and be careful, this can make you sleepy.   I will see you back in 6-8 weeks. Please do not hesitate to call if you have any questions or concerns. You can also message me in MyChart.   Drake Leach PA-C [phone]

## 2022-01-29 ENCOUNTER — Encounter: Payer: Self-pay | Admitting: Family Medicine

## 2022-01-29 ENCOUNTER — Ambulatory Visit: Payer: BC Managed Care – PPO | Admitting: Family Medicine

## 2022-01-29 VITALS — BP 126/80 | HR 80 | Ht 70.0 in | Wt 171.0 lb

## 2022-01-29 DIAGNOSIS — N529 Male erectile dysfunction, unspecified: Secondary | ICD-10-CM | POA: Diagnosis not present

## 2022-01-29 MED ORDER — TADALAFIL 20 MG PO TABS: ORAL_TABLET | ORAL | 11 refills | Status: DC

## 2022-01-29 NOTE — Progress Notes (Signed)
Date:  01/29/2022   Name:  Maxwell [name]   DOB:  06-27-1965   MRN:  [id number]   Chief Complaint: Erectile Dysfunction  Erectile Dysfunction This is a chronic problem. The current episode started more than 1 year ago. The nature of his difficulty is achieving erection and maintaining erection. He reports no anxiety. Irritative symptoms do not include frequency, nocturia or urgency. Pertinent negatives include no chills, dysuria or hematuria. Past treatments include tadalafil. The treatment provided moderate relief.    Lab Results  Component Value Date   NA 137 08/30/2019   K 3.5 08/30/2019   CO2 27 08/30/2019   GLUCOSE 118 (H) 08/30/2019   BUN 15 08/30/2019   CREATININE 1.37 (H) 08/30/2019   CALCIUM 9.1 08/30/2019   GFRNONAA 58 (L) 08/30/2019   No results found for: "CHOL", "HDL", "LDLCALC", "LDLDIRECT", "TRIG", "CHOLHDL" No results found for: "TSH" No results found for: "HGBA1C" Lab Results  Component Value Date   WBC 10.5 08/30/2019   HGB 14.8 08/30/2019   HCT 44.4 08/30/2019   MCV 94.9 08/30/2019   PLT 329 08/30/2019   No results found for: "ALT", "AST", "GGT", "ALKPHOS", "BILITOT" No results found for: "25OHVITD2", "25OHVITD3", "VD25OH"   Review of Systems  Constitutional:  Negative for chills and fever.  HENT:  Negative for drooling, ear discharge, ear pain and sore throat.   Respiratory:  Negative for cough, shortness of breath and wheezing.   Cardiovascular:  Negative for chest pain, palpitations and leg swelling.  Gastrointestinal:  Negative for abdominal pain, blood in stool, constipation, diarrhea and nausea.  Endocrine: Negative for polydipsia.  Genitourinary:  Negative for dysuria, frequency, hematuria, nocturia and urgency.  Musculoskeletal:  Negative for back pain, myalgias and neck pain.  Skin:  Negative for rash.  Allergic/Immunologic: Negative for environmental allergies.  Neurological:  Negative for dizziness and headaches.  Hematological:  Does  not bruise/bleed easily.  Psychiatric/Behavioral:  Negative for suicidal ideas. The patient is not nervous/anxious.     Patient Active Problem List   Diagnosis Date Noted   S/P lumbar microdiscectomy 11/19/2018   Lumbar spondylosis 11/19/2018   Chronic pain syndrome 10/08/2018   Failed back surgical syndrome 10/08/2018   Lumbar post-laminectomy syndrome 10/08/2018    No Known Allergies  Past Surgical History:  Procedure Laterality Date   LUMBAR LAMINECTOMY/DECOMPRESSION MICRODISCECTOMY Right 05/28/2017   Procedure: LUMBAR LAMINECTOMY/DECOMPRESSION MICRODISCECTOMY 1 LEVEL-L4-5;  Surgeon: Venetia Night, MD;  Location: [facility] OR[facility];  Service: Neurosurgery;  Laterality: Right;   SPINAL CORD STIMULATOR TRIAL Bilateral 09/06/2019   Procedure: THORACIC SPINAL CORD STIMULATOR PERCUTANEOUS TRIAL;  Surgeon: Lucy Chris, MD;  Location: [facility];  Service: Neurosurgery;  Laterality: Bilateral;    Social History   Tobacco Use   Smoking status: Some Days    Types: Cigars   Smokeless tobacco: Former    Types: Snuff   Tobacco comments:    counseled concerning meds and patches  Vaping Use   Vaping Use: Never used  Substance Use Topics   Alcohol use: Yes    Alcohol/week: 2.0 standard drinks of alcohol    Types: 2 Cans of beer per week   Drug use: No     Medication list has been reviewed and updated.  Current Meds  Medication Sig   methocarbamol (ROBAXIN) 500 MG tablet Take 1 tablet (500 mg total) by mouth every 8 (eight) hours as needed for muscle spasms.   Multiple Vitamins-Minerals (MULTIVITAMIN PO) Take 10 tablets by mouth daily.    Pseudoeph-Doxylamine-DM-APAP (  NYQUIL PO) Take 1 Dose by mouth at bedtime as needed (sleep).   tadalafil (CIALIS) 20 MG tablet TAKE 1 TABLET BY MOUTH DAILY AS  NEEDED       01/29/2022    9:43 AM 07/30/2021    9:50 AM 04/13/2020    4:18 PM  GAD 7 : Generalized Anxiety Score  Nervous, Anxious, on Edge 0 0 0  Control/stop worrying 0 0 0  Worry too  much - different things 0 0 0  Trouble relaxing 0 0 0  Restless 0 0 0  Easily annoyed or irritable 0 0 0  Afraid - awful might happen 0 0 0  Total GAD 7 Score 0 0 0  Anxiety Difficulty Not difficult at all Not difficult at all        01/29/2022    9:43 AM 07/30/2021    9:50 AM 04/13/2020    4:17 PM  Depression screen PHQ 2/9  Decreased Interest 0 0 0  Down, Depressed, Hopeless 0 0 0  PHQ - 2 Score 0 0 0  Altered sleeping 0 0 0  Tired, decreased energy 0 0 0  Change in appetite 0 0 0  Feeling bad or failure about yourself  0 0 0  Trouble concentrating 0 0 0  Moving slowly or fidgety/restless 0 0 0  Suicidal thoughts 0 0 0  PHQ-9 Score 0 0 0  Difficult doing work/chores Not difficult at all Not difficult at all     BP Readings from Last 3 Encounters:  01/29/22 126/80  01/21/22 130/78  07/30/21 102/60    Physical Exam Vitals and nursing note reviewed.  HENT:     Head: Normocephalic.     Right Ear: Tympanic membrane and external ear normal.     Left Ear: Tympanic membrane and external ear normal.     Nose: Nose normal.     Mouth/Throat:     Mouth: Mucous membranes are moist.  Eyes:     General: No scleral icterus.       Right eye: No discharge.        Left eye: No discharge.     Conjunctiva/sclera: Conjunctivae normal.     Pupils: Pupils are equal, round, and reactive to light.  Neck:     Thyroid: No thyromegaly.     Vascular: No JVD.     Trachea: No tracheal deviation.  Cardiovascular:     Rate and Rhythm: Normal rate and regular rhythm.     Heart sounds: Normal heart sounds. No murmur heard.    No friction rub. No gallop.  Pulmonary:     Effort: No respiratory distress.     Breath sounds: Normal breath sounds. No wheezing, rhonchi or rales.  Abdominal:     General: Bowel sounds are normal.     Palpations: Abdomen is soft. There is no mass.     Tenderness: There is no abdominal tenderness. There is no guarding or rebound.  Genitourinary:    Testes: Normal.   Musculoskeletal:        General: No tenderness. Normal range of motion.     Cervical back: Normal range of motion and neck supple.  Lymphadenopathy:     Cervical: No cervical adenopathy.  Skin:    General: Skin is warm.     Findings: No rash.  Neurological:     Mental Status: He is alert.     Wt Readings from Last 3 Encounters:  01/29/22 171 lb (77.6 kg)  01/21/22 170 lb 12.8  oz (77.5 kg)  07/30/21 170 lb (77.1 kg)    BP 126/80   Pulse 80   Ht 5\' 10"  (1.778 m)   Wt 171 lb (77.6 kg)   SpO2 99%   BMI 24.54 kg/m   Assessment and Plan:  1. Vasculogenic erectile dysfunction, unspecified vasculogenic erectile dysfunction type Chronic.  Controlled.  Stable.  Continue Cialis 20 mg once a day.  And will recheck in 1 year. - tadalafil (CIALIS) 20 MG tablet; TAKE 1 TABLET BY MOUTH DAILY AS  NEEDED  Dispense: 18 tablet; Refill: 11    , MD

## 2022-02-17 ENCOUNTER — Other Ambulatory Visit: Payer: Self-pay | Admitting: Orthopedic Surgery

## 2022-02-17 DIAGNOSIS — Z9889 Other specified postprocedural states: Secondary | ICD-10-CM

## 2022-02-17 DIAGNOSIS — M546 Pain in thoracic spine: Secondary | ICD-10-CM

## 2022-02-17 DIAGNOSIS — M961 Postlaminectomy syndrome, not elsewhere classified: Secondary | ICD-10-CM

## 2022-02-18 NOTE — Telephone Encounter (Signed)
Patient notified of refill.

## 2022-02-18 NOTE — Telephone Encounter (Signed)
Refill of robaxin approved and sent to pharmacy. Please let him know.

## 2022-03-05 NOTE — Progress Notes (Signed)
Referring Physician:  Juline Patch, MD [address] [place],  [place] [address]  Primary Physician:  Juline Patch, MD  History of Present Illness:  DOS: 05/28/17 - R L4-5 microdiscectomy by Dr. Izora Ribas DOS: 09/06/19 - SCS trial with percutaneous lead by Dr. Lacinda Axon  [name] has a history of chronic pain syndrome and ED.   Last seen by me on 01/21/22 for new complaint of intermittent thoracic pain. Thoracic xrays at last visit looked good.   He was sent to PT at his last visit and given robaxin to use prn. He is here for follow up.   He has done about 4 visits of PT. He has noticed slight improvement with PT, but he continues with intermittent mid back pain that radiates to his lower back. No leg pain. He has some radiation of pain into ribs on both sides. No numbness or tingling. No weakness in arms/legs.   Some improvement with robaxin.   Bowel/Bladder Dysfunction: none  Conservative measures:  Physical therapy: has done 4 visits and is still going Multimodal medical therapy including regular antiinflammatories: robaxin, aleve Injections: no epidural steroid injections  Past Surgery:  05/28/17 - R L4-5 microdiscectomy by Dr. Izora Ribas 09/06/19 - SCS trial with percutaneous lead by Dr. Olena Mater has no symptoms of cervical myelopathy.  The symptoms are causing a significant impact on the patient's life.   Review of Systems:  A 10 point review of systems is negative, except for the pertinent positives and negatives detailed in the HPI.  Past Medical History: Past Medical History:  Diagnosis Date   Bulging lumbar disc    Chronic pain    low back   DDD (degenerative disc disease), lumbar    History of kidney stones     Past Surgical History: Past Surgical History:  Procedure Laterality Date   LUMBAR LAMINECTOMY/DECOMPRESSION MICRODISCECTOMY Right 05/28/2017   Procedure: LUMBAR LAMINECTOMY/DECOMPRESSION MICRODISCECTOMY 1 LEVEL-L4-5;   Surgeon: Meade Maw, MD;  Location: [facility] OR[facility];  Service: Neurosurgery;  Laterality: Right;   SPINAL CORD STIMULATOR TRIAL Bilateral 09/06/2019   Procedure: THORACIC SPINAL CORD STIMULATOR PERCUTANEOUS TRIAL;  Surgeon: Deetta Perla, MD;  Location: [facility] ORS;  Service: Neurosurgery;  Laterality: Bilateral;    Allergies: Allergies as of 03/12/2022   (No Known Allergies)    Medications: Outpatient Encounter Medications as of 03/12/2022  Medication Sig   methocarbamol (ROBAXIN) 500 MG tablet TAKE 1 TABLET BY MOUTH EVERY 8 HOURS AS NEEDED FOR MUSCLE SPASM   Multiple Vitamins-Minerals (MULTIVITAMIN PO) Take 10 tablets by mouth daily.    Pseudoeph-Doxylamine-DM-APAP (NYQUIL PO) Take 1 Dose by mouth at bedtime as needed (sleep).   tadalafil (CIALIS) 20 MG tablet TAKE 1 TABLET BY MOUTH DAILY AS  NEEDED   [DISCONTINUED] methocarbamol (ROBAXIN) 500 MG tablet TAKE 1 TABLET BY MOUTH EVERY 8 HOURS AS NEEDED FOR MUSCLE SPASMS.   No facility-administered encounter medications on file as of 03/12/2022.    Social History: Social History   Tobacco Use   Smoking status: Some Days    Types: Cigars   Smokeless tobacco: Former    Types: Snuff   Tobacco comments:    counseled concerning meds and patches  Vaping Use   Vaping Use: Never used  Substance Use Topics   Alcohol use: Yes    Alcohol/week: 2.0 standard drinks of alcohol    Types: 2 Cans of beer per week   Drug use: No    Family Medical History: Family  History  Family history unknown: Yes    Physical Examination: Vitals:   03/12/22 0835  BP: 117/73  Pulse: 85  Temp: 98 F (36.7 C)      Awake, alert, oriented to person, place, and time.  Speech is clear and fluent. Fund of knowledge is appropriate.   Cranial Nerves: Pupils equal round and reactive to light.  Facial tone is symmetric.   He has no mild mid thoracic tenderness. No scapular tenderness.   No abnormal lesions on exposed skin.   Strength: Side Biceps Triceps  Deltoid Interossei Grip Wrist Ext. Wrist Flex.  R 5 5 5 5 5 5 5   L 5 5 5 5 5 5 5    Side Iliopsoas Quads Hamstring PF DF EHL  R 5 5 5 5 5 5   L 5 5 5 5 5 5    Reflexes are 2+ and symmetric at the biceps, triceps, brachioradialis, patella and achilles.   Hoffman's is absent.  Clonus is not present.   Bilateral upper and lower extremity sensation is intact to light touch.     Gait is normal.     Medical Decision Making  Imaging: No recent spinal imaging.    Assessment and Plan: Mr. Eltzroth is a pleasant 57 y.o. male with chronic lower back pain x years.   He continues with intermittent mid back pain with some radiation of pain into ribs on both sides. No numbness or tingling. No weakness in arms/legs.   Previous thoracic xrays looked good.   Treatment options discussed with patient and following plan made:   - MRI of thoracic spine to further evaluate pain and radiation into ribs. No improvement with time, medications, or PT (has done 4 visits).  - Okay to continue prn robaxin. Reviewed dosing and side effects. He knows it can make him sleepy.  - Will see him back after his MRI to review results.   I spent a total of 10 minutes in face-to-face and non-face-to-face activities related to this patient's care toda including review of outside records, review of imaging, review of symptoms, physical exam, discussion of differential diagnosis, discussion of treatment options, and documentation.   Geronimo Boot PA-C Dept. of Neurosurgery

## 2022-03-10 ENCOUNTER — Other Ambulatory Visit: Payer: Self-pay | Admitting: Orthopedic Surgery

## 2022-03-10 DIAGNOSIS — Z9889 Other specified postprocedural states: Secondary | ICD-10-CM

## 2022-03-10 DIAGNOSIS — M546 Pain in thoracic spine: Secondary | ICD-10-CM

## 2022-03-10 DIAGNOSIS — M961 Postlaminectomy syndrome, not elsewhere classified: Secondary | ICD-10-CM

## 2022-03-12 ENCOUNTER — Ambulatory Visit (INDEPENDENT_AMBULATORY_CARE_PROVIDER_SITE_OTHER): Payer: BC Managed Care – PPO | Admitting: Orthopedic Surgery

## 2022-03-12 ENCOUNTER — Encounter: Payer: Self-pay | Admitting: Orthopedic Surgery

## 2022-03-12 VITALS — BP 117/73 | HR 85 | Temp 98.0°F | Wt 169.4 lb

## 2022-03-12 DIAGNOSIS — M546 Pain in thoracic spine: Secondary | ICD-10-CM

## 2022-03-12 NOTE — Patient Instructions (Signed)
It was so nice to see you today. Thank you so much for coming in.    I want to get an MRI of your mid back (thoracic spine) to look into things further. We will get this approved through your insurance and [facility] will call you to schedule the appointment.   Continue with PT.   Okay to continue on robaxin to help with muscle spasms. Use only as needed and be careful, this can make you sleepy.   We will call you for a follow up once we get the MRI results back.   Please do not hesitate to call if you have any questions or concerns. You can also message me in [place].   Geronimo Boot PA-C [phone]

## 2022-03-17 ENCOUNTER — Ambulatory Visit
Admission: RE | Admit: 2022-03-17 | Discharge: 2022-03-17 | Disposition: A | Discharge status: Home / Self Care | Payer: BC Managed Care – PPO | Source: Ambulatory Visit | Attending: Orthopedic Surgery | Admitting: Orthopedic Surgery

## 2022-03-17 DIAGNOSIS — M546 Pain in thoracic spine: Secondary | ICD-10-CM | POA: Diagnosis not present

## 2022-03-21 NOTE — Progress Notes (Signed)
Referring Physician:  Juline Patch, MD [address] [place],  [place] [address]  Primary Physician:  Juline Patch, MD  History of Present Illness:  DOS: 05/28/17 - R L4-5 microdiscectomy by Dr. Izora Ribas DOS: 09/06/19 - SCS trial with percutaneous lead by Dr. Lacinda Axon  Maxwell [name] has a history of chronic pain syndrome and ED.   Last seen by me on 03/12/22 intermittent thoracic pain. Thoracic xrays looked good. MRI of thoracic spine ordered at his last visit and he is here to review the results.   He continues with intermittent mid back pain that radiates to his lower back. No leg pain. He has some radiation of pain into ribs on both sides. No numbness or tingling. No weakness in arms/legs. He thinks he may be seeing some improvement with PT.   Some improvement with prn robaxin.    Conservative measures:  Physical therapy: is currently going to [facility].  Multimodal medical therapy including regular antiinflammatories: robaxin, aleve Injections: no epidural steroid injections  Past Surgery:  05/28/17 - R L4-5 microdiscectomy by Dr. Izora Ribas 09/06/19 - SCS trial with percutaneous lead by Dr. Olena Mater has no symptoms of cervical myelopathy.  Review of Systems:  A 10 point review of systems is negative, except for the pertinent positives and negatives detailed in the HPI.  Past Medical History: Past Medical History:  Diagnosis Date   Bulging lumbar disc    Chronic pain    low back   DDD (degenerative disc disease), lumbar    History of kidney stones     Past Surgical History: Past Surgical History:  Procedure Laterality Date   LUMBAR LAMINECTOMY/DECOMPRESSION MICRODISCECTOMY Right 05/28/2017   Procedure: LUMBAR LAMINECTOMY/DECOMPRESSION MICRODISCECTOMY 1 LEVEL-L4-5;  Surgeon: Meade Maw, MD;  Location: [facility] OR[facility];  Service: Neurosurgery;  Laterality: Right;   SPINAL CORD STIMULATOR TRIAL Bilateral 09/06/2019   Procedure: THORACIC  SPINAL CORD STIMULATOR PERCUTANEOUS TRIAL;  Surgeon: Deetta Perla, MD;  Location: [facility] OR[facility];  Service: Neurosurgery;  Laterality: Bilateral;    Allergies: Allergies as of 03/22/2022   (No Known Allergies)    Medications: Outpatient Encounter Medications as of 03/22/2022  Medication Sig   methocarbamol (ROBAXIN) 500 MG tablet TAKE 1 TABLET BY MOUTH EVERY 8 HOURS AS NEEDED FOR MUSCLE SPASM   Multiple Vitamins-Minerals (MULTIVITAMIN PO) Take 10 tablets by mouth daily.    Pseudoeph-Doxylamine-DM-APAP (NYQUIL PO) Take 1 Dose by mouth at bedtime as needed (sleep).   tadalafil (CIALIS) 20 MG tablet TAKE 1 TABLET BY MOUTH DAILY AS  NEEDED   No facility-administered encounter medications on file as of 03/22/2022.    Social History: Social History   Tobacco Use   Smoking status: Some Days    Types: Cigars   Smokeless tobacco: Former    Types: Snuff   Tobacco comments:    counseled concerning meds and patches  Vaping Use   Vaping Use: Never used  Substance Use Topics   Alcohol use: Yes    Alcohol/week: 2.0 standard drinks of alcohol    Types: 2 Cans of beer per week   Drug use: No    Family Medical History: Family History  Family history unknown: Yes    Physical Examination: Vitals:   03/22/22 1103  BP: 118/60       Awake, alert, oriented to person, place, and time.  Speech is clear and fluent. Fund of knowledge is appropriate.   Cranial Nerves: Pupils equal round and reactive to light.  Facial tone is symmetric.   He has no mild mid thoracic tenderness. No scapular tenderness.   No abnormal lesions on exposed skin.   Strength: Side Biceps Triceps Deltoid Interossei Grip Wrist Ext. Wrist Flex.  R 5 5 5 5 5 5 5  $ L 5 5 5 5 5 5 5   $ Side Iliopsoas Quads Hamstring PF DF EHL  R 5 5 5 5 5 5  $ L 5 5 5 5 5 5   $ Bilateral upper and lower extremity sensation is intact to light touch.     Gait is normal.     Medical Decision Making  Imaging: Thoracic MRI dated 03/17/22:   FINDINGS: Alignment:  Physiologic.   Vertebrae: No acute or suspicious osseous findings.   Cord: The thoracic cord is relatively small in caliber, but unchanged from the previous study. There is no abnormal cord signal. Conus medullaris extends to the T12-L1 level.   Paraspinal and other soft tissues: No significant paraspinal findings. Oval T2 hyperintensity within the liver on the sagittal images is unchanged from previous MRI and likely indicative of a cyst or hemangioma.   Disc levels:   Sagittal images demonstrate a broad-based central disc protrusion at C6-7 without resulting cord deformity. This mildly narrows the foramina and could contribute to C7 nerve root encroachment. This level was not imaged previously and is not imaged in the axial plane on today's study.   No significant disc space findings at C7-T1, T1-2 or T2-3.   T3-4: Tiny central disc protrusion. No cord deformity or nerve root encroachment.   T4-5: Tiny central disc protrusion. No cord deformity or nerve root encroachment.   T5-6: Normal interspace.   T6-7: Stable small central disc protrusion. No cord deformity or nerve root encroachment.   T7-8: Stable tiny central disc protrusion. No cord deformity or nerve root encroachment.   T8-9: Stable tiny left paracentral disc protrusion. No cord deformity or nerve root encroachment.   T9-10: No significant findings.   T10-11: No significant findings.   T11-12: Tiny right paracentral disc protrusion. No cord deformity or nerve root encroachment.   T12-L1: Normal interspace.   IMPRESSION: 1. No acute findings or explanation for the patient's symptoms. 2. Stable small disc protrusions throughout the thoracic spine without cord deformity or nerve root encroachment. 3. Broad-based central disc protrusion at C6-7 without cord deformity, potentially symptomatic. Correlate clinically.     Electronically Signed   By: Richardean Sale M.D.   On:  03/18/2022 11:44   I have personally reviewed the images and agree with the above interpretation.    Assessment and Plan: Maxwell [name] is a pleasant 57 y.o. male with chronic lower back pain x years.   He continues with intermittent mid back pain with some radiation of pain into ribs on both sides. No numbness or tingling. No weakness in arms/legs.   Thoracic MRI looks good. Note made of disc at C6-C7- he has no neck or arm pain.   Treatment options discussed with patient and following plan made:   - He will continue PT. Can get back to gym on his own as tolerated.  - Okay to continue prn robaxin. Reviewed dosing and side effects. He knows it can make him sleepy.  - Follow up in 3 months and prn.   I spent a total of 10 minutes in face-to-face and non-face-to-face activities related to this patient's care toda including review of outside records, review of imaging, review of symptoms, physical exam, discussion of differential  diagnosis, discussion of treatment options, and documentation.   Geronimo Boot PA-C Dept. of Neurosurgery

## 2022-03-22 ENCOUNTER — Ambulatory Visit (INDEPENDENT_AMBULATORY_CARE_PROVIDER_SITE_OTHER): Payer: BC Managed Care – PPO | Admitting: Orthopedic Surgery

## 2022-03-22 ENCOUNTER — Encounter: Payer: Self-pay | Admitting: Orthopedic Surgery

## 2022-03-22 VITALS — BP 118/60 | Ht 70.0 in | Wt 165.4 lb

## 2022-03-22 DIAGNOSIS — M7918 Myalgia, other site: Secondary | ICD-10-CM | POA: Diagnosis not present

## 2022-03-22 DIAGNOSIS — Z9889 Other specified postprocedural states: Secondary | ICD-10-CM | POA: Diagnosis not present

## 2022-03-22 DIAGNOSIS — M546 Pain in thoracic spine: Secondary | ICD-10-CM

## 2022-03-31 ENCOUNTER — Other Ambulatory Visit: Payer: Self-pay | Admitting: Orthopedic Surgery

## 2022-03-31 DIAGNOSIS — M961 Postlaminectomy syndrome, not elsewhere classified: Secondary | ICD-10-CM

## 2022-03-31 DIAGNOSIS — M546 Pain in thoracic spine: Secondary | ICD-10-CM

## 2022-03-31 DIAGNOSIS — Z9889 Other specified postprocedural states: Secondary | ICD-10-CM

## 2022-04-01 NOTE — Telephone Encounter (Signed)
Prescription for robaxin okay. Sent to his pharmacy. Please let him know.

## 2022-04-27 ENCOUNTER — Other Ambulatory Visit: Payer: Self-pay | Admitting: Orthopedic Surgery

## 2022-04-27 DIAGNOSIS — Z9889 Other specified postprocedural states: Secondary | ICD-10-CM

## 2022-04-27 DIAGNOSIS — M961 Postlaminectomy syndrome, not elsewhere classified: Secondary | ICD-10-CM

## 2022-04-27 DIAGNOSIS — M546 Pain in thoracic spine: Secondary | ICD-10-CM

## 2022-04-29 NOTE — Telephone Encounter (Signed)
Please let him know that refill of robaxin was sent to his pharmacy.

## 2022-05-25 ENCOUNTER — Other Ambulatory Visit: Payer: Self-pay | Admitting: Orthopedic Surgery

## 2022-05-25 DIAGNOSIS — M546 Pain in thoracic spine: Secondary | ICD-10-CM

## 2022-05-25 DIAGNOSIS — M961 Postlaminectomy syndrome, not elsewhere classified: Secondary | ICD-10-CM

## 2022-05-25 DIAGNOSIS — Z9889 Other specified postprocedural states: Secondary | ICD-10-CM

## 2022-05-26 NOTE — Telephone Encounter (Signed)
Refill for robaxin sent to pharmacy. He has f/u on 06/20/22.

## 2022-06-19 NOTE — Progress Notes (Signed)
Referring Physician:  Duanne Limerick, MD [address] [place],  [place] [address]  Primary Physician:  Duanne Limerick, MD  History of Present Illness:  DOS: 05/28/17 - R L4-5 microdiscectomy by Dr. Myer Haff DOS: 09/06/19 - SCS trial with percutaneous lead by Dr. Adriana Simas  Mr. Cleo Byron has a history of chronic pain syndrome and ED.   Last seen by me on 03/22/22 for intermittent thoracic pain. Thoracic xrays looked good. MRI of thoracic spine looked good.  He does have broad based disc at C6-C7.   He was to continue with PT and prn robaxin.   He is here for follow up.   He is doing better. He still has intermittent mid thoracic pain that radiates into ribs. No scapular pain. He gets this pain maybe 2-3 times per week. It is generally in the morning and improves as he moves around. He is taking prn robaxin (only takes a few a week). He is back at the gym on his own and has gained 10+ pounds of muscle.    Conservative measures:  Physical therapy: did [facility]. Going to gym on his own.  Multimodal medical therapy including regular antiinflammatories: robaxin, aleve Injections: no epidural steroid injections  Past Surgery:  05/28/17 - R L4-5 microdiscectomy by Dr. Myer Haff 09/06/19 - SCS trial with percutaneous lead by Dr. Michaelene Song has no symptoms of cervical myelopathy.  Review of Systems:  A 10 point review of systems is negative, except for the pertinent positives and negatives detailed in the HPI.  Past Medical History: Past Medical History:  Diagnosis Date   Bulging lumbar disc    Chronic pain    low back   DDD (degenerative disc disease), lumbar    History of kidney stones     Past Surgical History: Past Surgical History:  Procedure Laterality Date   LUMBAR LAMINECTOMY/DECOMPRESSION MICRODISCECTOMY Right 05/28/2017   Procedure: LUMBAR LAMINECTOMY/DECOMPRESSION MICRODISCECTOMY 1 LEVEL-L4-5;  Surgeon: Venetia Night, MD;  Location: [facility]  ORS;  Service: Neurosurgery;  Laterality: Right;   SPINAL CORD STIMULATOR TRIAL Bilateral 09/06/2019   Procedure: THORACIC SPINAL CORD STIMULATOR PERCUTANEOUS TRIAL;  Surgeon: Lucy Chris, MD;  Location: [facility] ORS;  Service: Neurosurgery;  Laterality: Bilateral;    Allergies: Allergies as of 06/20/2022   (No Known Allergies)    Medications: Outpatient Encounter Medications as of 06/20/2022  Medication Sig   methocarbamol (ROBAXIN) 500 MG tablet TAKE 1 TABLET BY MOUTH EVERY 8 HOURS AS NEEDED FOR MUSCLE SPASMS   Multiple Vitamins-Minerals (MULTIVITAMIN PO) Take 10 tablets by mouth daily.    Pseudoeph-Doxylamine-DM-APAP (NYQUIL PO) Take 1 Dose by mouth at bedtime as needed (sleep).   tadalafil (CIALIS) 20 MG tablet TAKE 1 TABLET BY MOUTH DAILY AS  NEEDED   No facility-administered encounter medications on file as of 06/20/2022.    Social History: Social History   Tobacco Use   Smoking status: Some Days    Types: Cigars   Smokeless tobacco: Former    Types: Snuff   Tobacco comments:    counseled concerning meds and patches  Vaping Use   Vaping Use: Never used  Substance Use Topics   Alcohol use: Yes    Alcohol/week: 2.0 standard drinks of alcohol    Types: 2 Cans of beer per week   Drug use: No    Family Medical History: Family History  Family history unknown: Yes    Physical Examination: Vitals:   06/20/22 1515  BP: 121/72  Awake, alert, oriented to person, place, and time.  Speech is clear and fluent. Fund of knowledge is appropriate.   Cranial Nerves: Pupils equal round and reactive to light.  Facial tone is symmetric.   He has no mild mid thoracic tenderness. No scapular tenderness.   No abnormal lesions on exposed skin.   Strength: Side Biceps Triceps Deltoid Interossei Grip Wrist Ext. Wrist Flex.  R 5 5 5 5 5 5 5   L 5 5 5 5 5 5 5    Side Iliopsoas Quads Hamstring PF DF EHL  R 5 5 5 5 5 5   L 5 5 5 5 5 5    Bilateral upper and lower extremity sensation  is intact to light touch.     Gait is normal.     Medical Decision Making  Imaging: none  Assessment and Plan: Mr. Day is a pleasant 57 y.o. male is doing better. He still has intermittent mid thoracic pain that radiates into ribs. No scapular pain. He gets this pain maybe 2-3 times per week. It is generally in the morning and improves as he moves around.   Previous thoracic MRI looked good. Note made of disc at C6-C7- he has no neck or arm pain, no scapular pain.   Treatment options discussed with patient and following plan made:   - Continue with current activities and going to gym on his own.  - Okay to continue prn robaxin. Reviewed dosing and side effects. He knows it can make him sleepy. Call him prior to refilling- his pharmacy has been sending automatic refills and he only takes a few a week.  - Follow up in 3 months and prn.   I spent a total of 15 minutes in face-to-face and non-face-to-face activities related to this patient's care toda including review of outside records, review of imaging, review of symptoms, physical exam, discussion of differential diagnosis, discussion of treatment options, and documentation.   Drake Leach PA-C Dept. of Neurosurgery

## 2022-06-20 ENCOUNTER — Ambulatory Visit (INDEPENDENT_AMBULATORY_CARE_PROVIDER_SITE_OTHER): Payer: BC Managed Care – PPO | Admitting: Orthopedic Surgery

## 2022-06-20 ENCOUNTER — Encounter: Payer: Self-pay | Admitting: Orthopedic Surgery

## 2022-06-20 VITALS — BP 121/72 | Ht 70.0 in | Wt 165.0 lb

## 2022-06-20 DIAGNOSIS — Z9889 Other specified postprocedural states: Secondary | ICD-10-CM

## 2022-06-20 DIAGNOSIS — M546 Pain in thoracic spine: Secondary | ICD-10-CM

## 2022-06-21 ENCOUNTER — Encounter: Payer: Self-pay | Admitting: Orthopedic Surgery

## 2022-07-01 ENCOUNTER — Telehealth: Payer: Self-pay | Admitting: Orthopedic Surgery

## 2022-07-01 NOTE — Telephone Encounter (Signed)
Can fill out FMLA for him to be out up to 1-2 days per month. Does not need to be seen.   Thanks.

## 2022-07-01 NOTE — Telephone Encounter (Signed)
Patient was seen on 06/20/2022 for f/u of thoracic pain. He would like to know if you can sign for him to renew his intermittent leave just incase he has a flare up. The last form was filled out in December and will expire in June. It is only good for 6 months. He said that he only missed work once due to a flare up in the past 6 months. Will he need to be seen again?

## 2022-07-10 NOTE — Telephone Encounter (Signed)
Forms filled and out and faxed back to his HR.

## 2022-09-06 NOTE — Progress Notes (Deleted)
Referring Physician:  Duanne Limerick, MD [address] [place],  [place] [address]  Primary Physician:  Duanne Limerick, MD  History of Present Illness:  DOS: 05/28/17 - R L4-5 microdiscectomy by Dr. Myer Haff DOS: 09/06/19 - SCS trial with percutaneous lead by Dr. Adriana Simas  Mr. Darkiel Feathers has a history of chronic pain syndrome and ED.   Last seen by me on 06/20/22 for intermittent thoracic pain. Thoracic xrays looked good. MRI of thoracic spine looked good.  He does have broad based disc at C6-C7.   He was to continue with going to gym on his own. He has robaxin to take prn. FMLA papers filled out for him.   He is here for follow up.         He is doing better. He still has intermittent mid thoracic pain that radiates into ribs. No scapular pain. He gets this pain maybe 2-3 times per week. It is generally in the morning and improves as he moves around. He is taking prn robaxin (only takes a few a week). He is back at the gym on his own and has gained 10+ pounds of muscle.    Conservative measures:  Physical therapy: [facility]. Going to gym on his own.  Multimodal medical therapy including regular antiinflammatories: robaxin, aleve Injections: no epidural steroid injections  Past Surgery:  05/28/17 - R L4-5 microdiscectomy by Dr. Myer Haff 09/06/19 - SCS trial with percutaneous lead by Dr. Michaelene Song has no symptoms of cervical myelopathy.  Review of Systems:  A 10 point review of systems is negative, except for the pertinent positives and negatives detailed in the HPI.  Past Medical History: Past Medical History:  Diagnosis Date   Bulging lumbar disc    Chronic pain    low back   DDD (degenerative disc disease), lumbar    History of kidney stones     Past Surgical History: Past Surgical History:  Procedure Laterality Date   LUMBAR LAMINECTOMY/DECOMPRESSION MICRODISCECTOMY Right 05/28/2017   Procedure: LUMBAR LAMINECTOMY/DECOMPRESSION  MICRODISCECTOMY 1 LEVEL-L4-5;  Surgeon: Venetia Night, MD;  Location: [facility] OR[facility];  Service: Neurosurgery;  Laterality: Right;   SPINAL CORD STIMULATOR TRIAL Bilateral 09/06/2019   Procedure: THORACIC SPINAL CORD STIMULATOR PERCUTANEOUS TRIAL;  Surgeon: Lucy Chris, MD;  Location: [facility] OR[facility];  Service: Neurosurgery;  Laterality: Bilateral;    Allergies: Allergies as of 09/12/2022   (No Known Allergies)    Medications: Outpatient Encounter Medications as of 09/12/2022  Medication Sig   methocarbamol (ROBAXIN) 500 MG tablet TAKE 1 TABLET BY MOUTH EVERY 8 HOURS AS NEEDED FOR MUSCLE SPASMS   Multiple Vitamins-Minerals (MULTIVITAMIN PO) Take 10 tablets by mouth daily.    Pseudoeph-Doxylamine-DM-APAP (NYQUIL PO) Take 1 Dose by mouth at bedtime as needed (sleep).   tadalafil (CIALIS) 20 MG tablet TAKE 1 TABLET BY MOUTH DAILY AS  NEEDED   No facility-administered encounter medications on file as of 09/12/2022.    Social History: Social History   Tobacco Use   Smoking status: Some Days    Types: Cigars   Smokeless tobacco: Former    Types: Snuff   Tobacco comments:    counseled concerning meds and patches  Vaping Use   Vaping status: Never Used  Substance Use Topics   Alcohol use: Yes    Alcohol/week: 2.0 standard drinks of alcohol    Types: 2 Cans of beer per week   Drug use: No    Family Medical History: Family History  Family history unknown: Yes    Physical Examination: There were no vitals filed for this visit.   Awake, alert, oriented to person, place, and time.  Speech is clear and fluent. Fund of knowledge is appropriate.   Cranial Nerves: Pupils equal round and reactive to light.  Facial tone is symmetric.   He has no mild mid thoracic tenderness. No scapular tenderness.   No abnormal lesions on exposed skin.   Strength: Side Biceps Triceps Deltoid Interossei Grip Wrist Ext. Wrist Flex.  R 5 5 5 5 5 5 5   L 5 5 5 5 5 5 5    Side Iliopsoas Quads Hamstring PF DF  EHL  R 5 5 5 5 5 5   L 5 5 5 5 5 5    Bilateral upper and lower extremity sensation is intact to light touch.     Gait is normal.     Medical Decision Making  Imaging: none  Assessment and Plan: Mr. Lesperance is a pleasant 57 y.o. male is doing better. He still has intermittent mid thoracic pain that radiates into ribs. No scapular pain. He gets this pain maybe 2-3 times per week. It is generally in the morning and improves as he moves around.   Previous thoracic MRI looked good. Note made of disc at C6-C7- he has no neck or arm pain, no scapular pain.   Treatment options discussed with patient and following plan made:   - Continue with current activities and going to gym on his own.  - Okay to continue prn robaxin. Reviewed dosing and side effects. He knows it can make him sleepy. Call him prior to refilling- his pharmacy has been sending automatic refills and he only takes a few a week.  - Follow up in 3 months and prn.   I spent a total of 15 minutes in face-to-face and non-face-to-face activities related to this patient's care toda including review of outside records, review of imaging, review of symptoms, physical exam, discussion of differential diagnosis, discussion of treatment options, and documentation.   Drake Leach PA-C Dept. of Neurosurgery

## 2022-09-12 ENCOUNTER — Ambulatory Visit: Payer: BC Managed Care – PPO | Admitting: Orthopedic Surgery

## 2022-09-26 ENCOUNTER — Ambulatory Visit: Payer: BC Managed Care – PPO | Admitting: Family Medicine

## 2022-09-26 ENCOUNTER — Telehealth: Payer: Self-pay

## 2022-09-26 ENCOUNTER — Encounter: Payer: Self-pay | Admitting: Family Medicine

## 2022-09-26 VITALS — BP 122/78 | HR 80 | Ht 70.0 in | Wt 174.0 lb

## 2022-09-26 DIAGNOSIS — U071 COVID-19: Secondary | ICD-10-CM

## 2022-09-26 NOTE — Patient Instructions (Signed)
Quarantine and Scientist, product/process development and isolation are ways to protect the public from diseases that could: Harm a person's health. Spread easily (very contagious). Isolation is when you are sick and have to stay home and away from healthy people. Anne Shutter is when you have to stay home for a certain amount of time after being around a sick person. This is to see if you become sick. What diseases do I need to quarantine or isolate for? You may need to quarantine or isolate if you have been diagnosed or around someone with: Cholera. Diphtheria. Infectious TB (tuberculosis). Plague. Smallpox. Yellow fever. Viral hemorrhagic fevers, such as [place], Ebola, and [place]. Severe acute respiratory syndromes, such as COVID-19. Flu that can cause a pandemic. Measles. When should I quarantine? You should quarantine when you've been in close contact with someone who has, or is thought to have, a contagious disease. Close contact means you've been less than 6 ft (1.8 m) away from the person for 15 minutes or more within a 24-hour period. When should I isolate? You should isolate when: You are sick with a contagious disease. You test positive for a contagious disease, even if you don't have symptoms. You are sick and think you have a contagious disease. Get tested if you think you have a contagious disease. If your test results are negative, you can stop isolation. Sometimes, two negative tests are needed. If your test results are positive, isolate as told by your health care provider or health officials. Follow these instructions at home: Medicines  Take over-the-counter and prescription medicines as told by your provider. If you were prescribed antibiotics, take them as told by your provider. Do not stop using the antibiotic even if you start to feel better. Stay up to date on your vaccines. Get vaccines and booster shots as recommended. Lifestyle When you're in quarantine or  isolation: Wear a high-quality and well-fitted mask if you must be around others at home or in public. Do not get close to people who may get very sick. Use a bathroom that you don't have to share with others, if you can. Do not share personal items like cups, towels, and eating utensils. Practice good hygiene. Wash your hands often. Improve the air flow in your home by opening a window or door. This may stop the disease from spreading to others.  General instructions Do not travel if you are in quarantine or isolation. Travel only when your provider or health officials say it's okay. Call your provider or health department if you need advice on how to care for yourself. Talk to your provider if you are immunocompromised. This means your body cannot fight infections easily. Your body may not respond as well to vaccines. If you are sick, closely watch your symptoms. Follow instructions from your provider. You may be told to rest, drink fluids, and take medicine. Follow guidelines for quarantine and isolation, especially if you are in a place where diseases can spread easily and quickly. These places include jails, homeless shelters, and cruise ships. Where to find more information Centers for Disease Control and Prevention (CDC): [employer] This information is not intended to replace advice given to you by your health care provider. Make sure you discuss any questions you have with your health care provider. Document Revised: 02/05/2022 Document Reviewed: 10/12/2021 Elsevier Patient Education  2024 [employer].

## 2022-09-26 NOTE — Telephone Encounter (Signed)
Pt dropped off disability papers for the week. I called pt and explained that we gave him a note for today and tomorrow, to return to work on Saturday. That should be good for his work. He stated he doesn't know what is going on, but he has went through a mess to get this paperwork. I asked for his HR name and number. He gave me Brett Fairy at [phone]. I spoke with Mr. Providence Lanius and explained that we don't back date notes and we gave him note for today and tomorrow. He does not meet requirements for disability for a positive COVID from Tuesday (per patient on the positive date/test). I told Mr. Howell we did not repeat test today, we went off what patient said about the positive test. Mr. Providence Lanius said ok and we hung up. I called pt back and went over the call from Mr. Providence Lanius

## 2022-09-26 NOTE — Progress Notes (Signed)
Date:  09/26/2022   Name:  Maxwell [name]   DOB:  1965-07-14   MRN:  [id number]   Chief Complaint: Covid Positive (Tested positive 2 days ago, home test and [facility], congestion and sore throat, SOB)  Fever  This is a new problem. The current episode started in the past 7 days (Monday sx/Tuesday test positive). The problem occurs intermittently. The problem has been gradually improving. Associated symptoms include congestion, coughing and a sore throat. Pertinent negatives include no abdominal pain, chest pain, diarrhea, headaches, muscle aches, nausea or wheezing. He has tried fluids and acetaminophen for the symptoms. The treatment provided mild relief.  Risk factors: recent travel     Lab Results  Component Value Date   NA 137 08/30/2019   K 3.5 08/30/2019   CO2 27 08/30/2019   GLUCOSE 118 (H) 08/30/2019   BUN 15 08/30/2019   CREATININE 1.37 (H) 08/30/2019   CALCIUM 9.1 08/30/2019   GFRNONAA 58 (L) 08/30/2019   No results found for: "CHOL", "HDL", "LDLCALC", "LDLDIRECT", "TRIG", "CHOLHDL" No results found for: "TSH" No results found for: "HGBA1C" Lab Results  Component Value Date   WBC 10.5 08/30/2019   HGB 14.8 08/30/2019   HCT 44.4 08/30/2019   MCV 94.9 08/30/2019   PLT 329 08/30/2019   No results found for: "ALT", "AST", "GGT", "ALKPHOS", "BILITOT" No results found for: "25OHVITD2", "25OHVITD3", "VD25OH"   Review of Systems  Constitutional:  Positive for chills, fatigue and fever.  HENT:  Positive for congestion and sore throat. Negative for rhinorrhea, sinus pressure, sinus pain and sneezing.   Respiratory:  Positive for cough. Negative for chest tightness and wheezing.   Cardiovascular:  Negative for chest pain and palpitations.  Gastrointestinal:  Negative for abdominal pain, diarrhea and nausea.  Neurological:  Negative for headaches.    Patient Active Problem List   Diagnosis Date Noted   S/P lumbar microdiscectomy 11/19/2018   Lumbar spondylosis 11/19/2018    Chronic pain syndrome 10/08/2018   Failed back surgical syndrome 10/08/2018   Lumbar post-laminectomy syndrome 10/08/2018    No Known Allergies  Past Surgical History:  Procedure Laterality Date   LUMBAR LAMINECTOMY/DECOMPRESSION MICRODISCECTOMY Right 05/28/2017   Procedure: LUMBAR LAMINECTOMY/DECOMPRESSION MICRODISCECTOMY 1 LEVEL-L4-5;  Surgeon: Venetia Night, MD;  Location: [facility];  Service: Neurosurgery;  Laterality: Right;   SPINAL CORD STIMULATOR TRIAL Bilateral 09/06/2019   Procedure: THORACIC SPINAL CORD STIMULATOR PERCUTANEOUS TRIAL;  Surgeon: Lucy Chris, MD;  Location: [facility];  Service: Neurosurgery;  Laterality: Bilateral;    Social History   Tobacco Use   Smoking status: Some Days    Types: Cigars   Smokeless tobacco: Former    Types: Snuff   Tobacco comments:    counseled concerning meds and patches  Vaping Use   Vaping status: Never Used  Substance Use Topics   Alcohol use: Yes    Alcohol/week: 2.0 standard drinks of alcohol    Types: 2 Cans of beer per week   Drug use: No     Medication list has been reviewed and updated.  Current Meds  Medication Sig   methocarbamol (ROBAXIN) 500 MG tablet TAKE 1 TABLET BY MOUTH EVERY 8 HOURS AS NEEDED FOR MUSCLE SPASMS   Multiple Vitamins-Minerals (MULTIVITAMIN PO) Take 10 tablets by mouth daily.    Pseudoeph-Doxylamine-DM-APAP (NYQUIL PO) Take 1 Dose by mouth at bedtime as needed (sleep).   tadalafil (CIALIS) 20 MG tablet TAKE 1 TABLET BY MOUTH DAILY AS  NEEDED  09/26/2022    9:00 AM 01/29/2022    9:43 AM 07/30/2021    9:50 AM 04/13/2020    4:18 PM  GAD 7 : Generalized Anxiety Score  Nervous, Anxious, on Edge 0 0 0 0  Control/stop worrying 0 0 0 0  Worry too much - different things 0 0 0 0  Trouble relaxing 0 0 0 0  Restless 0 0 0 0  Easily annoyed or irritable 1 0 0 0  Afraid - awful might happen 0 0 0 0  Total GAD 7 Score 1 0 0 0  Anxiety Difficulty Not difficult at all Not difficult at all  Not difficult at all        09/26/2022    8:59 AM 01/29/2022    9:43 AM 07/30/2021    9:50 AM  Depression screen PHQ 2/9  Decreased Interest 0 0 0  Down, Depressed, Hopeless 0 0 0  PHQ - 2 Score 0 0 0  Altered sleeping 0 0 0  Tired, decreased energy 1 0 0  Change in appetite 1 0 0  Feeling bad or failure about yourself  0 0 0  Trouble concentrating 0 0 0  Moving slowly or fidgety/restless 0 0 0  Suicidal thoughts 0 0 0  PHQ-9 Score 2 0 0  Difficult doing work/chores Not difficult at all Not difficult at all Not difficult at all    BP Readings from Last 3 Encounters:  09/26/22 122/78  06/20/22 121/72  03/22/22 118/60    Physical Exam Vitals and nursing note reviewed.  HENT:     Head: Normocephalic.     Right Ear: Tympanic membrane, ear canal and external ear normal.     Left Ear: Tympanic membrane, ear canal and external ear normal.     Nose: Nose normal.     Mouth/Throat:     Mouth: Mucous membranes are moist.  Eyes:     General: No scleral icterus.       Right eye: No discharge.        Left eye: No discharge.     Conjunctiva/sclera: Conjunctivae normal.     Pupils: Pupils are equal, round, and reactive to light.  Neck:     Thyroid: No thyromegaly.     Vascular: No JVD.     Trachea: No tracheal deviation.  Cardiovascular:     Rate and Rhythm: Normal rate and regular rhythm.     Heart sounds: Normal heart sounds. No murmur heard.    No friction rub. No gallop.  Pulmonary:     Effort: No respiratory distress.     Breath sounds: Normal breath sounds. No wheezing, rhonchi or rales.  Abdominal:     General: Bowel sounds are normal.     Palpations: Abdomen is soft. There is no mass.     Tenderness: There is no abdominal tenderness. There is no guarding or rebound.  Musculoskeletal:        General: No tenderness. Normal range of motion.     Cervical back: Normal range of motion and neck supple.  Lymphadenopathy:     Cervical: No cervical adenopathy.  Skin:     General: Skin is warm.     Findings: No rash.  Neurological:     Mental Status: He is alert and oriented to person, place, and time.     Cranial Nerves: No cranial nerve deficit.     Deep Tendon Reflexes: Reflexes are normal and symmetric.     Wt Readings from Last  3 Encounters:  09/26/22 174 lb (78.9 kg)  06/20/22 165 lb (74.8 kg)  03/22/22 165 lb 6.4 oz (75 kg)    BP 122/78   Pulse 80   Ht 5\' 10"  (1.778 m)   Wt 174 lb (78.9 kg)   SpO2 98%   BMI 24.97 kg/m   Assessment and Plan:  1. COVID New onset.  Stable.  Became symptomatic on Monday following a cruise.  Tested positive on Tuesday.  Patient is currently decreasing with his symptomatology but still remains with some myalgias and cough.  Examination is normal with no evidence of pneumonia.  Patient is been told to take acetaminophen and consume fluids.  It has been discussed that he is 5 days in isolation and 5 days with mask.  Will recheck on as-needed basis if symptomatology worsens or continues.   Elizabeth Sauer, MD

## 2022-10-01 ENCOUNTER — Ambulatory Visit: Payer: BC Managed Care – PPO | Admitting: Orthopedic Surgery

## 2022-10-04 NOTE — Progress Notes (Unsigned)
Referring Physician:  No referring provider defined for this encounter.  Primary Physician:  Maxwell Limerick, MD  History of Present Illness:  DOS: 05/28/17 - R L4-5 microdiscectomy by Dr. Myer [name] DOS: 09/06/19 - SCS trial with percutaneous lead by Dr. Adriana [name]  Maxwell [name] has a history of chronic pain syndrome and ED.   Last seen by me on 06/20/22 for intermittent thoracic pain. Thoracic xrays looked good. MRI of thoracic spine looked good.  He does have broad based disc at C6-C7.   He was to continue with going to gym on his own. He has robaxin to take prn. FMLA papers filled out for him.   He is here for follow up.   Overall he is doing well. He had a little flare up of pain in mid back yesterday, but feels okay today. He has not been to gym in 3 weeks or so. He went on a cruise and then got covid. He is feeling better from this. He still has intermittent mid thoracic pain that radiates into ribs. He has robaxin to use prn.    Conservative measures:  Physical therapy: did Maxwell [name] PT. Going to gym on his own.  Multimodal medical therapy including regular antiinflammatories: robaxin, aleve Injections: no epidural steroid injections  Past Surgery:  05/28/17 - R L4-5 microdiscectomy by Dr. Myer [name] 09/06/19 - SCS trial with percutaneous lead by Dr. Michaelene [name] has no symptoms of cervical myelopathy.  Review of Systems:  A 10 point review of systems is negative, except for the pertinent positives and negatives detailed in the HPI.  Past Medical History: Past Medical History:  Diagnosis Date   Bulging lumbar disc    Chronic pain    low back   DDD (degenerative disc disease), lumbar    History of kidney stones     Past Surgical History: Past Surgical History:  Procedure Laterality Date   LUMBAR LAMINECTOMY/DECOMPRESSION MICRODISCECTOMY Right 05/28/2017   Procedure: LUMBAR LAMINECTOMY/DECOMPRESSION MICRODISCECTOMY 1 LEVEL-L4-5;  Surgeon: Maxwell Night,  MD;  Location: [facility];  Service: Neurosurgery;  Laterality: Right;   SPINAL CORD STIMULATOR TRIAL Bilateral 09/06/2019   Procedure: THORACIC SPINAL CORD STIMULATOR PERCUTANEOUS TRIAL;  Surgeon: Maxwell Chris, MD;  Location: [facility];  Service: Neurosurgery;  Laterality: Bilateral;    Allergies: Allergies as of 10/09/2022   (No Known Allergies)    Medications: Outpatient Encounter Medications as of 10/09/2022  Medication Sig   methocarbamol (ROBAXIN) 500 MG tablet TAKE 1 TABLET BY MOUTH EVERY 8 HOURS AS NEEDED FOR MUSCLE SPASMS   Multiple Vitamins-Minerals (MULTIVITAMIN PO) Take 10 tablets by mouth daily.    Pseudoeph-Doxylamine-DM-APAP (NYQUIL PO) Take 1 Dose by mouth at bedtime as needed (sleep).   tadalafil (CIALIS) 20 MG tablet TAKE 1 TABLET BY MOUTH DAILY AS  NEEDED   No facility-administered encounter medications on file as of 10/09/2022.    Social History: Social History   Tobacco Use   Smoking status: Some Days    Types: Cigars   Smokeless tobacco: Former    Types: Snuff   Tobacco comments:    counseled concerning meds and patches  Vaping Use   Vaping status: Never Used  Substance Use Topics   Alcohol use: Yes    Alcohol/week: 2.0 standard drinks of alcohol    Types: 2 Cans of beer per week   Drug use: No    Family Medical History: Family History  Family history unknown: Yes    Physical Examination: Vitals:   10/09/22  0836  BP: 130/76    Awake, alert, oriented to person, place, and time.  Speech is clear and fluent. Fund of knowledge is appropriate.   Cranial Nerves: Pupils equal round and reactive to light.  Facial tone is symmetric.   No abnormal lesions on exposed skin.   Strength: Side Biceps Triceps Deltoid Interossei Grip Wrist Ext. Wrist Flex.  R 5 5 5 5 5 5 5   L 5 5 5 5 5 5 5    Side Iliopsoas Quads Hamstring PF DF EHL  R 5 5 5 5 5 5   L 5 5 5 5 5 5    Bilateral upper and lower extremity sensation is intact to light touch.     Gait is  normal.     Medical Decision Making  Imaging: none  Assessment and Plan: [name] is a pleasant 57 y.o. male is doing better. He still has intermittent mid thoracic pain that radiates into ribs. He is planning to get back into the gym this week.   Previous thoracic MRI looked good. Note made of disc at C6-C7- he has no neck or arm pain, no scapular pain.   Treatment options discussed with patient and following plan made:   - Continue with current activities and going to gym on his own.  - Okay to continue prn robaxin. Reviewed dosing and side effects. He knows it can make him sleepy. Call him prior to refilling- his pharmacy has been sending automatic refills and he only takes a few a week.  - Follow up in 3 months and prn.   I spent a total of 15 minutes in face-to-face and non-face-to-face activities related to this patient's care toda including review of outside records, review of imaging, review of symptoms, physical exam, discussion of differential diagnosis, discussion of treatment options, and documentation.   Maxwell Leach PA-C Dept. of Neurosurgery

## 2022-10-09 ENCOUNTER — Encounter: Payer: Self-pay | Admitting: Orthopedic Surgery

## 2022-10-09 ENCOUNTER — Ambulatory Visit (INDEPENDENT_AMBULATORY_CARE_PROVIDER_SITE_OTHER): Payer: BC Managed Care – PPO | Admitting: Orthopedic Surgery

## 2022-10-09 VITALS — BP 130/76 | Ht 70.0 in | Wt 174.0 lb

## 2022-10-09 DIAGNOSIS — M546 Pain in thoracic spine: Secondary | ICD-10-CM

## 2022-10-26 ENCOUNTER — Other Ambulatory Visit: Payer: Self-pay | Admitting: Family Medicine

## 2022-10-26 DIAGNOSIS — N529 Male erectile dysfunction, unspecified: Secondary | ICD-10-CM

## 2022-10-28 NOTE — Telephone Encounter (Signed)
Requested medication (s) are due for refill today - no  Requested medication (s) are on the active medication list -yes  Future visit scheduled -no  Last refill: 01/29/22 #18 11RF  Notes to clinic: Pharmacy request- 90 day supply requires changes to original Rx, fails lab protocol- no recorded results  Requested Prescriptions  Pending Prescriptions Disp Refills   tadalafil (CIALIS) 20 MG tablet [Pharmacy Med Name: TADALAFIL 20MG  A1 TABLET] 18 tablet 11    Sig: TAKE 1 TABLET BY MOUTH AS NEEDED     Urology: Erectile Dysfunction Agents Failed - 10/26/2022 11:47 AM      Failed - AST in normal range and within 360 days    No results found for: "POCAST", "AST"       Failed - ALT in normal range and within 360 days    No results found for: "ALT", "LABALT", "POCALT"       Passed - Last BP in normal range    BP Readings from Last 1 Encounters:  10/09/22 130/76         Passed - Valid encounter within last 12 months    Recent Outpatient Visits           1 month ago COVID   [facility], [place]   9 months ago Vasculogenic erectile dysfunction, unspecified vasculogenic erectile dysfunction type   [place] [facility], [facility]   1 year ago Vasculogenic erectile dysfunction, unspecified vasculogenic erectile dysfunction type   [place] [facility], MD   2 years ago Vasculogenic erectile dysfunction, unspecified vasculogenic erectile dysfunction type   [place] [facility], MD   4 years ago Colon cancer screening   [place] [facility], MD                 Requested Prescriptions  Pending Prescriptions Disp Refills   tadalafil (CIALIS) 20 MG tablet [Pharmacy Med Name: TADALAFIL 20MG  A1 TABLET] 18  tablet 11    Sig: TAKE 1 TABLET BY MOUTH AS NEEDED     Urology: Erectile Dysfunction Agents Failed - 10/26/2022 11:47 AM      Failed - AST in normal range and within 360 days    No results found for: "POCAST", "AST"       Failed - ALT in normal range and within 360 days    No results found for: "ALT", "LABALT", "POCALT"       Passed - Last BP in normal range    BP Readings from Last 1 Encounters:  10/09/22 130/76         Passed - Valid encounter within last 12 months    Recent Outpatient Visits           1 month ago COVID   [facility], [place]   9 months ago Vasculogenic erectile dysfunction, unspecified vasculogenic erectile dysfunction type   [facility], [place]   1 year ago Vasculogenic erectile dysfunction, unspecified vasculogenic erectile dysfunction type   [facility], [place]   2 years ago Vasculogenic erectile dysfunction, unspecified vasculogenic erectile  dysfunction type   [facility], MD   4 years ago Colon cancer screening   [facility] at [facility], [place]

## 2022-11-10 ENCOUNTER — Other Ambulatory Visit: Payer: Self-pay | Admitting: Family Medicine

## 2022-11-10 DIAGNOSIS — N529 Male erectile dysfunction, unspecified: Secondary | ICD-10-CM

## 2023-01-03 NOTE — Progress Notes (Signed)
Referring Physician:  Duanne Limerick, MD [address] [place],  [place] [address]  Primary Physician:  Maxwell Limerick, MD  History of Present Illness:  DOS: 05/28/17 - R L4-5 microdiscectomy by Dr. Myer [name] DOS: 09/06/19 - SCS trial with percutaneous lead by Dr. Adriana [name]  [name] has a history of chronic pain syndrome and ED.   Last seen by me on 10/09/22 for intermittent thoracic pain. Thoracic xrays looked good. MRI of thoracic spine looked good.  He does have broad based disc at C6-C7.   He was doing well at last visit and was working on getting back into the gym.   He is here for follow up.   He's had constant mid to lower back pain since Wednesday. No radiation of pain into ribs or into arms/legs. No numbness, tingling, or weakness. He woke up with this pain. He was doing well prior to this and was back at the gym. He is not taking anything for his pain. Not sure if robaxin helps.    Conservative measures:  Physical therapy: did [name]. Going to gym on his own.  Multimodal medical therapy including regular antiinflammatories: robaxin, aleve Injections: no epidural steroid injections  Past Surgery:  05/28/17 - R L4-5 microdiscectomy by Dr. Myer [name] 09/06/19 - SCS trial with percutaneous lead by Dr. Michaelene [name] has no symptoms of cervical myelopathy.  Review of Systems:  A 10 point review of systems is negative, except for the pertinent positives and negatives detailed in the HPI.  Past Medical History: Past Medical History:  Diagnosis Date   Bulging lumbar disc    Chronic pain    low back   DDD (degenerative disc disease), lumbar    History of kidney stones     Past Surgical History: Past Surgical History:  Procedure Laterality Date   LUMBAR LAMINECTOMY/DECOMPRESSION MICRODISCECTOMY Right 05/28/2017   Procedure: LUMBAR LAMINECTOMY/DECOMPRESSION MICRODISCECTOMY 1 LEVEL-L4-5;  Surgeon: Maxwell Night, MD;  Location: [facility];   Service: Neurosurgery;  Laterality: Right;   SPINAL CORD STIMULATOR TRIAL Bilateral 09/06/2019   Procedure: THORACIC SPINAL CORD STIMULATOR PERCUTANEOUS TRIAL;  Surgeon: Maxwell Chris, MD;  Location: [facility];  Service: Neurosurgery;  Laterality: Bilateral;    Allergies: Allergies as of 01/13/2023   (No Known Allergies)    Medications: Outpatient Encounter Medications as of 01/13/2023  Medication Sig   meloxicam (MOBIC) 15 MG tablet Take 1 tablet (15 mg total) by mouth daily. Take with food.   methocarbamol (ROBAXIN) 500 MG tablet TAKE 1 TABLET BY MOUTH EVERY 8 HOURS AS NEEDED FOR MUSCLE SPASMS   Multiple Vitamins-Minerals (MULTIVITAMIN PO) Take 10 tablets by mouth daily.    Pseudoeph-Doxylamine-DM-APAP (NYQUIL PO) Take 1 Dose by mouth at bedtime as needed (sleep).   tadalafil (CIALIS) 20 MG tablet TAKE 1 TABLET BY MOUTH AS NEEDED   [DISCONTINUED] tadalafil (CIALIS) 20 MG tablet TAKE 1 TABLET BY MOUTH DAILY AS  NEEDED   No facility-administered encounter medications on file as of 01/13/2023.    Social History: Social History   Tobacco Use   Smoking status: Some Days    Types: Cigars   Smokeless tobacco: Former    Types: Snuff   Tobacco comments:    counseled concerning meds and patches  Vaping Use   Vaping status: Never Used  Substance Use Topics   Alcohol use: Yes    Alcohol/week: 2.0 standard drinks of alcohol    Types: 2 Cans of beer per week  Drug use: No    Family Medical History: Family History  Family history unknown: Yes    Physical Examination: Vitals:   01/13/23 1041  BP: 130/80    Awake, alert, oriented to person, place, and time.  Speech is clear and fluent. Fund of knowledge is appropriate.   Cranial Nerves: Pupils equal round and reactive to light.  Facial tone is symmetric.   He has some tenderness at lower thoracic region on right.   No abnormal lesions on exposed skin.   Strength: Side Biceps Triceps Deltoid Interossei Grip Wrist Ext. Wrist  Flex.  R 5 5 5 5 5 5 5   L 5 5 5 5 5 5 5    Side Iliopsoas Quads Hamstring PF DF EHL  R 5 5 5 5 5 5   L 5 5 5 5 5 5    Bilateral upper and lower extremity sensation is intact to light touch.     Gait is normal.     Medical Decision Making  Imaging: none  Assessment and Plan: [name] is a pleasant 57 y.o. male with constant right sided lower thoracic pain since Wednesday. No radiation of pain into ribs or into arms/legs. No numbness, tingling, or weakness. He woke up with this pain.  Previous thoracic MRI looked good. Note made of disc at C6-C7- he has no neck or arm pain, no scapular pain.   Treatment options discussed with patient and following plan made:   - Trial of mobic. Reviewed dosing and side effects. Take with food.  - If no improvement, consider adding robaxin back at 750mg  and/or revisiting PT.  - Follow up in 3 months and prn.   I spent a total of 15 minutes in face-to-face and non-face-to-face activities related to this patient's care toda including review of outside records, review of imaging, review of symptoms, physical exam, discussion of differential diagnosis, discussion of treatment options, and documentation.   Maxwell Leach PA-C Dept. of Neurosurgery

## 2023-01-04 ENCOUNTER — Other Ambulatory Visit: Payer: Self-pay | Admitting: Family Medicine

## 2023-01-04 DIAGNOSIS — N529 Male erectile dysfunction, unspecified: Secondary | ICD-10-CM

## 2023-01-13 ENCOUNTER — Encounter: Payer: Self-pay | Admitting: Orthopedic Surgery

## 2023-01-13 ENCOUNTER — Ambulatory Visit (INDEPENDENT_AMBULATORY_CARE_PROVIDER_SITE_OTHER): Payer: BC Managed Care – PPO | Admitting: Orthopedic Surgery

## 2023-01-13 VITALS — BP 130/80 | Ht 70.0 in | Wt 174.0 lb

## 2023-01-13 DIAGNOSIS — M546 Pain in thoracic spine: Secondary | ICD-10-CM

## 2023-01-13 MED ORDER — MELOXICAM 15 MG PO TABS: 15.0000 mg | ORAL_TABLET | Freq: Every day | ORAL | 1 refills | Status: DC

## 2023-01-16 ENCOUNTER — Telehealth: Payer: Self-pay | Admitting: Orthopedic Surgery

## 2023-01-16 DIAGNOSIS — M546 Pain in thoracic spine: Secondary | ICD-10-CM

## 2023-01-16 MED ORDER — METHYLPREDNISOLONE 4 MG PO TBPK: ORAL_TABLET | ORAL | 0 refills | Status: DC

## 2023-01-16 NOTE — Addendum Note (Signed)
Addended byDrake Leach on: 01/16/2023 05:52 PM   Modules accepted: Orders

## 2023-01-16 NOTE — Telephone Encounter (Signed)
Spoke to patient and he will try the prednisone.

## 2023-01-16 NOTE — Telephone Encounter (Signed)
Please tell him to stop the mobic, I took it off his med list. I sent in a medrol dose pack. He should take it as directed.

## 2023-01-16 NOTE — Telephone Encounter (Signed)
Gave him mobic at last visit. No improvement.   We can restart the muscle relaxer (methocarbamol) and do 750mg  instead of the 500mg  OR we can do a short course of prednisone, OR we can try another NSAID such as voltaren (would take twice a day).   Let me know what he wants to do.

## 2023-01-17 NOTE — Telephone Encounter (Signed)
Patient aware of medication fill.

## 2023-01-20 ENCOUNTER — Telehealth: Payer: Self-pay | Admitting: Orthopedic Surgery

## 2023-01-20 NOTE — Telephone Encounter (Signed)
Spoke to patient and scheduled him for 12/10.

## 2023-01-20 NOTE — Progress Notes (Unsigned)
Referring Physician:  Duanne Limerick, MD [address] [place],  [place] [address]  Primary Physician:  Duanne Limerick, MD  History of Present Illness:  DOS: 05/28/17 - R L4-5 microdiscectomy by Dr. Myer Haff DOS: 09/06/19 - SCS trial with percutaneous lead by Dr. Adriana Simas  Maxwell [name] has a history of chronic pain syndrome and ED.   Last seen by me on 01/13/23 for a few days of increased mid to lower back pain no radiation of pain into arm/legs/ribs.   He was given mobic. This did not help and medrol dose pack was called in on Friday. Previous thoracic MRI looked good. Note made of disc at C6-C7.   He continues with pain and wanted to be seen today for follow up.   He has constant right sided mid to lower lumbar back pain. No leg pain. No numbness, tingling, or weakness. No known injury. No relief with medrol dose pack or mobic.    Conservative measures:  Physical therapy: did [facility]. Going to gym on his own.  Multimodal medical therapy including regular antiinflammatories: robaxin, aleve Injections: no epidural steroid injections  Past Surgery:  05/28/17 - R L4-5 microdiscectomy by Dr. Myer Haff 09/06/19 - SCS trial with percutaneous lead by Dr. Michaelene Song has no symptoms of cervical myelopathy.  Review of Systems:  A 10 point review of systems is negative, except for the pertinent positives and negatives detailed in the HPI.  Past Medical History: Past Medical History:  Diagnosis Date   Bulging lumbar disc    Chronic pain    low back   DDD (degenerative disc disease), lumbar    History of kidney stones     Past Surgical History: Past Surgical History:  Procedure Laterality Date   LUMBAR LAMINECTOMY/DECOMPRESSION MICRODISCECTOMY Right 05/28/2017   Procedure: LUMBAR LAMINECTOMY/DECOMPRESSION MICRODISCECTOMY 1 LEVEL-L4-5;  Surgeon: Venetia Night, MD;  Location: [facility];  Service: Neurosurgery;  Laterality: Right;   SPINAL CORD  STIMULATOR TRIAL Bilateral 09/06/2019   Procedure: THORACIC SPINAL CORD STIMULATOR PERCUTANEOUS TRIAL;  Surgeon: Lucy Chris, MD;  Location: [facility];  Service: Neurosurgery;  Laterality: Bilateral;    Allergies: Allergies as of 01/21/2023   (No Known Allergies)    Medications: Outpatient Encounter Medications as of 01/21/2023  Medication Sig   methylPREDNISolone (MEDROL DOSEPAK) 4 MG TBPK tablet Use as directed x 6 days.   Multiple Vitamins-Minerals (MULTIVITAMIN PO) Take 10 tablets by mouth daily.    Pseudoeph-Doxylamine-DM-APAP (NYQUIL PO) Take 1 Dose by mouth at bedtime as needed (sleep).   tadalafil (CIALIS) 20 MG tablet TAKE 1 TABLET BY MOUTH AS NEEDED   [DISCONTINUED] methocarbamol (ROBAXIN) 500 MG tablet TAKE 1 TABLET BY MOUTH EVERY 8 HOURS AS NEEDED FOR MUSCLE SPASMS (Patient not taking: Reported on 01/21/2023)   No facility-administered encounter medications on file as of 01/21/2023.    Social History: Social History   Tobacco Use   Smoking status: Some Days    Types: Cigars   Smokeless tobacco: Former    Types: Snuff   Tobacco comments:    counseled concerning meds and patches  Vaping Use   Vaping status: Never Used  Substance Use Topics   Alcohol use: Yes    Alcohol/week: 2.0 standard drinks of alcohol    Types: 2 Cans of beer per week   Drug use: No    Family Medical History: Family History  Family history unknown: Yes    Physical Examination: Vitals:   01/21/23 1027  BP: 132/80    Awake, alert, oriented to person, place, and time.  Speech is clear and fluent. Fund of knowledge is appropriate.   Cranial Nerves: Pupils equal round and reactive to light.  Facial tone is symmetric.   He has some tenderness at mid to lower lumbar spine on right.   No abnormal lesions on exposed skin.   Strength:  Side Iliopsoas Quads Hamstring PF DF EHL  R 5 5 5 5 5 5   L 5 5 5 5 5 5    Bilateral lower extremity sensation is intact to light touch.     Gait is  normal.     Medical Decision Making  Imaging: none  Assessment and Plan: Maxwell [name] is a pleasant 57 y.o. male with constant right sided mid to lower lumbar pain since last Wednesday. No radiation of pain into ribs or into arms/legs. No numbness, tingling, or weakness. He woke up with this pain.  Previous thoracic MRI looked good. Note made of disc at C6-C7- he has no neck or arm pain, no scapular pain. History of Right L4-5 microdiscectomy by Dr. Myer Haff on 05/28/17.   Pain does appear more myofascial in nature.   Treatment options discussed with patient and following plan made:   - Finish out medrol dose pack.  - New prescription for robaxin 750mg  tid prn spasms. Reviewed side effects. He knows this can make his sleepy.  - Lumbar xrays ordered.  - PT ordered at [facility] in [place].  - Work note given. Will keep him out until his follow up with me in 4 weeks. He can let me know if is ready to go back prior to his visit.   I spent a total of 15 minutes in face-to-face and non-face-to-face activities related to this patient's care toda including review of outside records, review of imaging, review of symptoms, physical exam, discussion of differential diagnosis, discussion of treatment options, and documentation.   Drake Leach PA-C Dept. of Neurosurgery

## 2023-01-20 NOTE — Telephone Encounter (Signed)
Patient is calling to let our office know that he picked up the steroid pack on Friday and he has been taking them, but has gotten no relief. He also states that he left work Thursday a little after 3pm and has not been able to work since. He is requesting a work note and an appointment to see Kennyth Arnold. Please advise.

## 2023-01-21 ENCOUNTER — Telehealth: Payer: Self-pay | Admitting: Orthopedic Surgery

## 2023-01-21 ENCOUNTER — Ambulatory Visit (INDEPENDENT_AMBULATORY_CARE_PROVIDER_SITE_OTHER): Payer: BC Managed Care – PPO | Admitting: Orthopedic Surgery

## 2023-01-21 ENCOUNTER — Encounter: Payer: Self-pay | Admitting: Orthopedic Surgery

## 2023-01-21 ENCOUNTER — Ambulatory Visit
Admission: RE | Admit: 2023-01-21 | Discharge: 2023-01-21 | Disposition: A | Discharge status: Home / Self Care | Payer: BC Managed Care – PPO | Attending: Orthopedic Surgery | Admitting: Orthopedic Surgery

## 2023-01-21 ENCOUNTER — Ambulatory Visit
Admission: RE | Admit: 2023-01-21 | Discharge: 2023-01-21 | Disposition: A | Discharge status: Home / Self Care | Payer: BC Managed Care – PPO | Source: Ambulatory Visit | Attending: Orthopedic Surgery | Admitting: Orthopedic Surgery

## 2023-01-21 VITALS — BP 132/80 | Ht 70.0 in | Wt 174.0 lb

## 2023-01-21 DIAGNOSIS — M545 Low back pain, unspecified: Secondary | ICD-10-CM | POA: Diagnosis not present

## 2023-01-21 DIAGNOSIS — Z9889 Other specified postprocedural states: Secondary | ICD-10-CM

## 2023-01-21 DIAGNOSIS — M47816 Spondylosis without myelopathy or radiculopathy, lumbar region: Secondary | ICD-10-CM | POA: Diagnosis not present

## 2023-01-21 DIAGNOSIS — M549 Dorsalgia, unspecified: Secondary | ICD-10-CM | POA: Diagnosis not present

## 2023-01-21 MED ORDER — METHOCARBAMOL 750 MG PO TABS: 750.0000 mg | ORAL_TABLET | Freq: Three times a day (TID) | ORAL | 0 refills | Status: DC | PRN

## 2023-01-21 NOTE — Patient Instructions (Signed)
It was so nice to see you today. Thank you so much for coming in.    I ordered xrays of your lower back. You can get these at [facility] (building with the white pillars) off of [address]. The address is [address] [place], [place] [address]. You do not need any appointment. I will message you after I look at them. It is taking 14-21 days for [place] to get the results. You will see them on MyChart.    I sent physical therapy orders to [facility] in [place]. You can call them if you don't hear from them to schedule your visit.   I also sent a prescription for methocarbamol 750mg  to help with muscle spasms. Use only as needed and be careful, this can make you sleepy.   I gave you a work note. Message me if you can go back to work before your follow up.   I will see you back in January. Please do not hesitate to call if you have any questions or concerns. You can also message me in MyChart.   Drake Leach PA-C [phone]     The physicians and staff at [facility] at [facility] are committed to providing excellent care. You may receive a survey asking for feedback about your experience at our office. We value you your feedback and appreciate you taking the time to to fill it out. The [facility] leadership team is also available to discuss your experience in person, feel free to contact us [phone].

## 2023-01-21 NOTE — Telephone Encounter (Signed)
Patient is calling to request that his work note be updated to 01/16/2023 instead of 01/15/2023. He would like this faxed to his employer.   Fax: [phone] Attn: Debby Bud

## 2023-01-21 NOTE — Telephone Encounter (Signed)
Okay to change from 12/4 to 12/5. Can keep everything else the same.   Thanks.

## 2023-01-22 ENCOUNTER — Encounter: Payer: Self-pay | Admitting: Family Medicine

## 2023-01-22 ENCOUNTER — Encounter: Payer: Self-pay | Admitting: Orthopedic Surgery

## 2023-01-22 NOTE — Telephone Encounter (Signed)
Letter has been updated and faxed.

## 2023-01-22 NOTE — Telephone Encounter (Signed)
Lumbar xrays dated 01/21/23:  Diffuse lumbar spondylosis with mild DDD L4-L5 and moderate DDD L5-S1. Slight retrolisthesis L4-L5.   Xrays are not yet read by radiology.

## 2023-02-14 NOTE — Progress Notes (Signed)
 Referring Physician:  Joshua Cathryne BROCKS, MD [address] [place],  [place] [address]  Primary Physician:  Joshua Cathryne BROCKS, MD  History of Present Illness:  DOS: 05/28/17 - R L4-5 microdiscectomy by Dr. Clois DOS: 09/06/19 - SCS trial with percutaneous lead by Dr. Bluford  Maxwell [name] has a history of chronic pain syndrome and ED.   Last seen by me on 01/21/23 with constant right sided mid to lower lumbar back pain. No leg pain. No numbness, tingling, or weakness. No known injury.    Lumbar xrays showed diffuse lumbar spondylosis with mild DDD L4-L5 and moderate DDD L5-S1. Slight retrolisthesis L4-L5.   He was given robaxin  750mg  and sent to PT. Given work note keeping him out until this visit.   He is here for follow up.   He continues with constant right sided lower back pain. No leg pain. No numbness, tingling, or weakness. No known injury. No relief with medrol  dose pack or mobic . Minimal relief with robaxin .   He had his first PT visit this morning.   He notes a few days of intermittent dizziness. Not associated with taking robaxin .    Conservative measures:  Physical therapy: initial eval with [facility] 02/17/23.  Multimodal medical therapy including regular antiinflammatories: robaxin , aleve Injections: no epidural steroid injections  Past Surgery:  05/28/17 - R L4-5 microdiscectomy by Dr. Clois 09/06/19 - SCS trial with percutaneous lead by Dr. Bluford Maxwell [name] has no symptoms of cervical myelopathy.  Review of Systems:  A 10 point review of systems is negative, except for the pertinent positives and negatives detailed in the HPI.  Past Medical History: Past Medical History:  Diagnosis Date   Bulging lumbar disc    Chronic pain    low back   DDD (degenerative disc disease), lumbar    History of kidney stones     Past Surgical History: Past Surgical History:  Procedure Laterality Date   LUMBAR LAMINECTOMY/DECOMPRESSION  MICRODISCECTOMY Right 05/28/2017   Procedure: LUMBAR LAMINECTOMY/DECOMPRESSION MICRODISCECTOMY 1 LEVEL-L4-5;  Surgeon: Clois Fret, MD;  Location: [facility] OR[facility];  Service: Neurosurgery;  Laterality: Right;   SPINAL CORD STIMULATOR TRIAL Bilateral 09/06/2019   Procedure: THORACIC SPINAL CORD STIMULATOR PERCUTANEOUS TRIAL;  Surgeon: Bluford Standing, MD;  Location: [facility] OR[facility];  Service: Neurosurgery;  Laterality: Bilateral;    Allergies: Allergies as of 02/17/2023   (No Known Allergies)    Medications: Outpatient Encounter Medications as of 02/17/2023  Medication Sig   methocarbamol  (ROBAXIN ) 750 MG tablet TAKE 1 TABLET (750 MG TOTAL) BY MOUTH EVERY 8 (EIGHT) HOURS AS NEEDED FOR MUSCLE SPASMS. THIS CAN MAKE YOU SLEEPY.   Multiple Vitamins-Minerals (MULTIVITAMIN PO) Take 10 tablets by mouth daily.    tadalafil  (CIALIS ) 20 MG tablet TAKE 1 TABLET BY MOUTH AS NEEDED   [DISCONTINUED] meloxicam  (MOBIC ) 15 MG tablet Take 15 mg by mouth daily.   Pseudoeph-Doxylamine-DM-APAP (NYQUIL PO) Take 1 Dose by mouth at bedtime as needed (sleep). (Patient not taking: Reported on 02/17/2023)   [DISCONTINUED] methocarbamol  (ROBAXIN -750) 750 MG tablet Take 1 tablet (750 mg total) by mouth every 8 (eight) hours as needed for muscle spasms. This can make you sleepy.   [DISCONTINUED] methylPREDNISolone  (MEDROL  DOSEPAK) 4 MG TBPK tablet Use as directed x 6 days.   No facility-administered encounter medications on file as of 02/17/2023.    Social History: Social History   Tobacco Use   Smoking status: Some Days    Types: Cigars   Smokeless tobacco:  Former    Types: Snuff   Tobacco comments:    counseled concerning meds and patches  Vaping Use   Vaping status: Never Used  Substance Use Topics   Alcohol use: Yes    Alcohol/week: 2.0 standard drinks of alcohol    Types: 2 Cans of beer per week   Drug use: No    Family Medical History: Family History  Family history unknown: Yes    Physical  Examination: Vitals:   02/17/23 1004  BP: 118/86    Awake, alert, oriented to person, place, and time.  Speech is clear and fluent. Fund of knowledge is appropriate.   Cranial Nerves: Pupils equal round and reactive to light.  Facial tone is symmetric.   He has some tenderness at mid to lower lumbar spine on right.   No abnormal lesions on exposed skin.   Strength:  Side Iliopsoas Quads Hamstring PF DF EHL  R 5 5 5 5 5 5   L 5 5 5 5 5 5    Bilateral lower extremity sensation is intact to light touch.     Reflexes are 2+ and symmetric at the patella and achilles.   Clonus is not present.    Gait is normal.     Medical Decision Making  Imaging: none  Assessment and Plan: Maxwell [name] is a pleasant 58 y.o. male with 3-4 week history of constant right sided mid to lower lumbar pain. No radiation of pain into ribs or into arms/legs. No numbness, tingling, or weakness. He woke up with this pain.  Previous thoracic MRI looked good. Note made of disc at C6-C7- he has no neck or arm pain, no scapular pain. History of Right L4-5 microdiscectomy by Dr. Clois on 05/28/17.   Treatment options discussed with patient and following plan made:   - MRI of lumbar spine to further evaluate new right sided LBP. No improvement with time or medications.  - Continue with PT- had initial eval this morning at [place].  - Continue on prn robaxin . Reviewed side effects. He knows this can make his sleepy.  - Follow up with PCP regarding dizziness. Discussed this could be side effect from robaxin , but he states he is only using robaxin  rarely. He's been dizzy on days he doesn't take it.  - Work note given. Will keep him out for next 6 weeks for now. Can readjust as needed.  - Scheduled follow up with me to review MRI once I have the results.   I spent a total of 15 minutes in face-to-face and non-face-to-face activities related to this patient's care toda including review of outside records, review  of imaging, review of symptoms, physical exam, discussion of differential diagnosis, discussion of treatment options, and documentation.   Glade Boys PA-C Dept. of Neurosurgery

## 2023-02-16 ENCOUNTER — Telehealth: Payer: Self-pay | Admitting: Orthopedic Surgery

## 2023-02-16 DIAGNOSIS — M545 Low back pain, unspecified: Secondary | ICD-10-CM

## 2023-02-16 DIAGNOSIS — Z9889 Other specified postprocedural states: Secondary | ICD-10-CM

## 2023-02-17 ENCOUNTER — Encounter: Payer: Self-pay | Admitting: Orthopedic Surgery

## 2023-02-17 ENCOUNTER — Ambulatory Visit (INDEPENDENT_AMBULATORY_CARE_PROVIDER_SITE_OTHER): Payer: BC Managed Care – PPO | Admitting: Orthopedic Surgery

## 2023-02-17 VITALS — BP 118/86 | Ht 70.0 in | Wt 174.0 lb

## 2023-02-17 DIAGNOSIS — M5136 Other intervertebral disc degeneration, lumbar region with discogenic back pain only: Secondary | ICD-10-CM

## 2023-02-17 DIAGNOSIS — Z9889 Other specified postprocedural states: Secondary | ICD-10-CM

## 2023-02-17 DIAGNOSIS — M545 Low back pain, unspecified: Secondary | ICD-10-CM | POA: Diagnosis not present

## 2023-02-17 DIAGNOSIS — R42 Dizziness and giddiness: Secondary | ICD-10-CM

## 2023-02-17 DIAGNOSIS — M47816 Spondylosis without myelopathy or radiculopathy, lumbar region: Secondary | ICD-10-CM

## 2023-02-17 NOTE — Telephone Encounter (Signed)
Refill of robaxin sent to his pharmacy. Please let him know.

## 2023-02-17 NOTE — Telephone Encounter (Signed)
 Patient notified

## 2023-02-24 ENCOUNTER — Ambulatory Visit
Admission: RE | Admit: 2023-02-24 | Discharge: 2023-02-24 | Disposition: A | Discharge status: Home / Self Care | Payer: BC Managed Care – PPO | Source: Ambulatory Visit | Attending: Orthopedic Surgery | Admitting: Orthopedic Surgery

## 2023-02-24 DIAGNOSIS — Z9889 Other specified postprocedural states: Secondary | ICD-10-CM | POA: Diagnosis not present

## 2023-02-24 DIAGNOSIS — M5136 Other intervertebral disc degeneration, lumbar region with discogenic back pain only: Secondary | ICD-10-CM | POA: Diagnosis not present

## 2023-02-24 DIAGNOSIS — M4807 Spinal stenosis, lumbosacral region: Secondary | ICD-10-CM | POA: Diagnosis not present

## 2023-02-24 DIAGNOSIS — M47816 Spondylosis without myelopathy or radiculopathy, lumbar region: Secondary | ICD-10-CM | POA: Insufficient documentation

## 2023-02-24 DIAGNOSIS — M5137 Other intervertebral disc degeneration, lumbosacral region with discogenic back pain only: Secondary | ICD-10-CM | POA: Diagnosis not present

## 2023-02-24 DIAGNOSIS — M545 Low back pain, unspecified: Secondary | ICD-10-CM | POA: Diagnosis not present

## 2023-02-24 DIAGNOSIS — M5126 Other intervertebral disc displacement, lumbar region: Secondary | ICD-10-CM | POA: Diagnosis not present

## 2023-03-06 ENCOUNTER — Ambulatory Visit: Payer: BC Managed Care – PPO | Admitting: Orthopedic Surgery

## 2023-03-06 ENCOUNTER — Telehealth: Payer: Self-pay | Admitting: Orthopedic Surgery

## 2023-03-06 ENCOUNTER — Encounter: Payer: Self-pay | Admitting: Orthopedic Surgery

## 2023-03-06 VITALS — BP 108/72 | Ht 70.0 in | Wt 174.0 lb

## 2023-03-06 DIAGNOSIS — M5136 Other intervertebral disc degeneration, lumbar region with discogenic back pain only: Secondary | ICD-10-CM | POA: Diagnosis not present

## 2023-03-06 DIAGNOSIS — Z9889 Other specified postprocedural states: Secondary | ICD-10-CM

## 2023-03-06 DIAGNOSIS — M47816 Spondylosis without myelopathy or radiculopathy, lumbar region: Secondary | ICD-10-CM

## 2023-03-06 DIAGNOSIS — M48061 Spinal stenosis, lumbar region without neurogenic claudication: Secondary | ICD-10-CM | POA: Diagnosis not present

## 2023-03-06 DIAGNOSIS — M899 Disorder of bone, unspecified: Secondary | ICD-10-CM | POA: Diagnosis not present

## 2023-03-06 NOTE — Addendum Note (Signed)
Addended byDrake Leach on: 03/06/2023 02:35 PM   Modules accepted: Orders

## 2023-03-06 NOTE — Telephone Encounter (Signed)
Patient was seen today he is calling that he has decided to have the MRI with contrast.

## 2023-03-06 NOTE — Progress Notes (Addendum)
Referring Physician:  Duanne Limerick, MD [address] [place],  [place] [address]  Primary Physician:  Duanne Limerick, MD  History of Present Illness:  DOS: 05/28/17 - R L4-5 microdiscectomy by Dr. Myer Haff DOS: 09/06/19 - SCS trial with percutaneous lead by Dr. Adriana Simas  Maxwell [name] has a history of chronic pain syndrome and ED.   Last seen by me on 02/17/23 with constant right sided mid to lower lumbar pain. No radiation of pain into ribs or into arms/legs. No numbness, tingling, or weakness. He woke up with this pain.   Previous thoracic MRI looked good. Note made of disc at C6-C7- he has no neck or arm pain, no scapular pain. History of Right L4-5 microdiscectomy by Dr. Myer Haff on 05/28/17.   He was to continue with PT and he is here to review his lumbar MRI scan. He has been out of work.   No change in symptoms, he continues with more constant right sided LBP with no leg pain. No numbness, tingling, or weakness. He is in PT and feels better at PT, but pain returns when he gets home.   He did follow up with PCP regarding dizziness. It has resolved.   No bowel or bladder issues.    Conservative measures:  Physical therapy: initial eval with Roseanne Reno PT 02/17/23.  Multimodal medical therapy including regular antiinflammatories: robaxin, aleve Injections: no epidural steroid injections  Past Surgery:  05/28/17 - R L4-5 microdiscectomy by Dr. Myer Haff 09/06/19 - SCS trial with percutaneous lead by Dr. Michaelene Song has no symptoms of cervical myelopathy.  Review of Systems:  A 10 point review of systems is negative, except for the pertinent positives and negatives detailed in the HPI.  Past Medical History: Past Medical History:  Diagnosis Date   Bulging lumbar disc    Chronic pain    low back   DDD (degenerative disc disease), lumbar    History of kidney stones     Past Surgical History: Past Surgical History:  Procedure Laterality Date    LUMBAR LAMINECTOMY/DECOMPRESSION MICRODISCECTOMY Right 05/28/2017   Procedure: LUMBAR LAMINECTOMY/DECOMPRESSION MICRODISCECTOMY 1 LEVEL-L4-5;  Surgeon: Venetia Night, MD;  Location: [facility];  Service: Neurosurgery;  Laterality: Right;   SPINAL CORD STIMULATOR TRIAL Bilateral 09/06/2019   Procedure: THORACIC SPINAL CORD STIMULATOR PERCUTANEOUS TRIAL;  Surgeon: Lucy Chris, MD;  Location: [facility];  Service: Neurosurgery;  Laterality: Bilateral;    Allergies: Allergies as of 03/06/2023   (No Known Allergies)    Medications: Outpatient Encounter Medications as of 03/06/2023  Medication Sig   methocarbamol (ROBAXIN) 750 MG tablet TAKE 1 TABLET (750 MG TOTAL) BY MOUTH EVERY 8 (EIGHT) HOURS AS NEEDED FOR MUSCLE SPASMS. THIS CAN MAKE YOU SLEEPY.   Multiple Vitamins-Minerals (MULTIVITAMIN PO) Take 10 tablets by mouth daily.    Pseudoeph-Doxylamine-DM-APAP (NYQUIL PO) Take 1 Dose by mouth at bedtime as needed (sleep). (Patient not taking: Reported on 02/17/2023)   tadalafil (CIALIS) 20 MG tablet TAKE 1 TABLET BY MOUTH AS NEEDED   No facility-administered encounter medications on file as of 03/06/2023.    Social History: Social History   Tobacco Use   Smoking status: Some Days    Types: Cigars   Smokeless tobacco: Former    Types: Snuff   Tobacco comments:    counseled concerning meds and patches  Vaping Use   Vaping status: Never Used  Substance Use Topics   Alcohol use: Yes    Alcohol/week: 2.0 standard drinks  of alcohol    Types: 2 Cans of beer per week   Drug use: No    Family Medical History: Family History  Family history unknown: Yes    Physical Examination: There were no vitals filed for this visit.   Awake, alert, oriented to person, place, and time.  Speech is clear and fluent. Fund of knowledge is appropriate.   Cranial Nerves: Pupils equal round and reactive to light.  Facial tone is symmetric.   No lower lumbar tenderness.    No abnormal lesions on exposed  skin.   Strength: Side Iliopsoas Quads Hamstring PF DF EHL  R 5 5 5 5 5 5   L 5 5 5 5 5 5    Bilateral lower extremity sensation is intact to light touch.     Reflexes are 2+ and symmetric at the patella and achilles.   Clonus is not present.    Gait is normal.     Medical Decision Making  Imaging: Lumbar MRI dated 02/24/23:  FINDINGS: Segmentation:  Standard.   Alignment:  Mild retrolisthesis of L4 on L5 and L5 on S1.   Vertebrae: No fracture, evidence of discitis, or bone lesion. Degenerative endplate changes of the inferior and superior endplates of L5 on S1.   Conus medullaris and cauda equina: Conus extends to the T12-L1 disc space level. Conus and cauda equina appear normal.   Paraspinal and other soft tissues: There is nonspecific T2 hyperintense lesion along the left iliac wing measuring 2.1 x 1.6 cm (series 8, image 35). This is only partially imaged in 2020, and is new compared to 2019.   Disc levels:   T12-L1: Unremarkable   L1-L2: Unremarkable   L2-L3: Unremarkable   L3-L4: Mild bilateral facet degenerative change. No significant disc bulge. No spinal canal narrowing. Mild bilateral neural foraminal narrowing.   L4-L5: Right hemilaminectomy. Minimal disc bulge. Mild bilateral facet degenerative change. Mild narrowing of the right lateral recess. No significant spinal canal narrowing. Mild-to-moderate bilateral neural foraminal narrowing.   L5-S1: Circumferential disc bulge, slightly eccentric to the left. There is narrowing of the left lateral recess, new/increased from prior exam. No significant spinal canal narrowing. Mild bilateral facet degenerative change. Moderate bilateral neural foraminal narrowing.   IMPRESSION: 1. Eccentric left disc bulge that results in mild narrowing of the left lateral recess, new/increased from 2020. 2. Moderate bilateral neural foraminal narrowing at L5-S1, unchanged. 3. Nonspecific T2 hyperintense lesion along  the left iliac wing measuring 2.1 x 1.6 cm. This is only partially imaged in 2020, and is new compared to 2019. Recommend further evaluation with a pelvic MRI with and without contrast.     Electronically Signed   By: Lorenza Cambridge M.D.   On: 03/05/2023 10:15  I have personally reviewed the images and agree with the above interpretation.   Assessment and Plan: Mr. Meyerowitz continues with more constant right sided mid to lower lumbar pain. No radiation of pain into ribs or into arms/legs. No numbness, tingling, or weakness.   History of Right L4-5 microdiscectomy by Dr. Myer Haff on 05/28/17.   He has known lumbar spondylosis and multilevel bilateral foraminal stenosis. Also with DDD L4-L5 and L5-S1.   LBP likely from underlying spondylosis/DDD.   Previous thoracic MRI looked good. Note made of disc at C6-C7- he has no neck or arm pain, no scapular pain.   Treatment options discussed with patient and following plan made:   - Recommend he continue with PT for lumbar spine.  - He declines  injections. He hates needles.  - If no improvement with PT, may have him follow back up with Dr. Myer Haff to discuss possible surgery options.  - He remains out of work for now.   Of note, lumbar MRI also showed nonspecific T2 hyperintense lesion along the left iliac wing measuring 2.1 x 1.6 cm. This is only partially imaged in 2020, and is new compared to 2019.  Radiology recommends further evaluation with a pelvic MRI with and without contrast. Initially he refused exam with contrast as he is afraid of needles.   He called after his visit and decided to have MRI pelvis done with and without contrast. This was ordered.   He will f/u with me to review MRI results once I have them back.   I spent a total of 15 minutes in face-to-face and non-face-to-face activities related to this patient's care toda including review of outside records, review of imaging, review of symptoms, physical exam,  discussion of differential diagnosis, discussion of treatment options, and documentation.   Drake Leach PA-C Dept. of Neurosurgery

## 2023-03-13 ENCOUNTER — Ambulatory Visit
Admission: RE | Admit: 2023-03-13 | Discharge: 2023-03-13 | Disposition: A | Discharge status: Home / Self Care | Payer: BC Managed Care – PPO | Source: Ambulatory Visit | Attending: Orthopedic Surgery | Admitting: Orthopedic Surgery

## 2023-03-13 ENCOUNTER — Telehealth: Payer: Self-pay | Admitting: Orthopedic Surgery

## 2023-03-13 DIAGNOSIS — M899 Disorder of bone, unspecified: Secondary | ICD-10-CM | POA: Insufficient documentation

## 2023-03-13 DIAGNOSIS — Z9889 Other specified postprocedural states: Secondary | ICD-10-CM | POA: Insufficient documentation

## 2023-03-13 DIAGNOSIS — M546 Pain in thoracic spine: Secondary | ICD-10-CM

## 2023-03-13 DIAGNOSIS — M47816 Spondylosis without myelopathy or radiculopathy, lumbar region: Secondary | ICD-10-CM | POA: Diagnosis not present

## 2023-03-13 DIAGNOSIS — M5136 Other intervertebral disc degeneration, lumbar region with discogenic back pain only: Secondary | ICD-10-CM | POA: Diagnosis not present

## 2023-03-13 DIAGNOSIS — M1990 Unspecified osteoarthritis, unspecified site: Secondary | ICD-10-CM | POA: Diagnosis not present

## 2023-03-13 MED ORDER — GADOBUTROL 1 MMOL/ML IV SOLN
7.5000 mL | Freq: Once | INTRAVENOUS | Status: AC | PRN
  Administered 2023-03-13: 7.5 mL via INTRAVENOUS

## 2023-03-13 NOTE — Telephone Encounter (Signed)
I called patient and left him a voicemail to call the office back. Wanted to know if he requested this refill for the medication.

## 2023-03-13 NOTE — Telephone Encounter (Signed)
Patient called back- he does not need this medication and he also informed [facility] that he did not need this.

## 2023-03-13 NOTE — Telephone Encounter (Signed)
Okay thank you

## 2023-03-28 NOTE — Progress Notes (Signed)
Telephone Visit- Progress Note: Referring Physician:  Duanne Limerick, MD [address] [place],  [place] [address]  Primary Physician:  Duanne Limerick, MD  This visit was performed via telephone.  Patient location: home Provider location: office  I spent a total of 10 minutes non-face-to-face activities for this visit on the date of this encounter including review of current clinical condition and response to treatment.    Patient has given verbal consent to this telephone visits and we reviewed the limitations of a telephone visit. Patient wishes to proceed.    Chief Complaint:  review MRI  History of Present Illness:  DOS: 05/28/17 - R L4-5 microdiscectomy by Dr. Myer Haff DOS: 09/06/19 - SCS trial with percutaneous lead by Dr. Adriana Simas  [name] has a history of chronic pain syndrome and ED.   Last seen by me on 03/06/23. He has known lumbar spondylosis and multilevel bilateral foraminal stenosis. Also with DDD L4-L5 and L5-S1.    LBP likely from underlying spondylosis/DDD.    Previous thoracic MRI looked good. Note made of disc at C6-C7- he has no neck or arm pain, no scapular pain.   MRI showed lesion at left iliac wing and MRI pelvis with and without contrast was recommended.   He is here to review that imaging. He was to continue with PT for his lumbar spine.   He continues with constant right sided LBP with no leg pain. No numbness, tingling, or weakness. He has not see any significant improvement with over 6 weeks of PT. This pain has been constant since his visit in December.   He has occasional cigar- maybe twice a month. Does not smoke cigarettes.   He tried to go back to work yesterday and was unable to do this due to pain.    No bowel or bladder issues.   Conservative measures:  Physical therapy: initial eval with Roseanne Reno PT 02/17/23 and is still going  Multimodal medical therapy including regular antiinflammatories: robaxin,  aleve Injections: no epidural steroid injections  Past Surgery:  05/28/17 - R L4-5 microdiscectomy by Dr. Myer Haff 09/06/19 - SCS trial with percutaneous lead by Dr. Michaelene Song has no symptoms of cervical myelopathy.    Exam: No exam done as this was a telephone encounter.     Imaging: MRI of pelvis dated 03/13/23:  FINDINGS: Bones:   No hip fracture, dislocation or avascular necrosis.   No periosteal reaction or bone destruction.   1.5 x 1.1 x 1.4 cm well-circumscribed T1 hypointense and heterogeneous T2 hyperintense bone lesion with areas of sclerosis and mild heterogeneous enhancement. No surrounding bone marrow edema. No bone destruction or periosteal reaction.   Mild osteoarthritis of bilateral SI joints.   Moderate disc height loss at L4-5 with a mild broad-based disc bulge. Severe disc height loss at L5-S1 with a broad disc bulge.   Articular cartilage and labrum   Articular cartilage:  No chondral defect.   Labrum: Grossly intact, but evaluation is limited by lack of intraarticular fluid.   Joint or bursal effusion   Joint effusion:  No hip joint effusion.  No SI joint effusion.   Bursae:  No bursal fluid.   Muscles and tendons   Flexors: Normal.   Extensors: Normal.   Abductors: Normal.   Adductors: Normal.   Gluteals: Normal.   Hamstrings: Normal.   Other findings   No pelvic free fluid. No fluid collection or hematoma. No inguinal lymphadenopathy. No inguinal hernia.  IMPRESSION: 1. A 1.5 x 1.1 x 1.4 cm well-circumscribed bone lesion without aggressive features. 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 Recommend follow-up pelvic MRI with and without contrast in 6 months to assess for stability.     Electronically Signed   By: Elige Ko M.D.   On: 03/19/2023 17:30    I have personally reviewed the images and agree with the above  interpretation.   Assessment and Plan: Mr. Schwartz continues with constant right sided LBP with no leg pain. No numbness, tingling, or weakness. He has not see any significant improvement with over 6 weeks of PT. This pain has been constant since his visit in December.   History of Right L4-5 microdiscectomy by Dr. Myer Haff on 05/28/17.   He has known lumbar spondylosis and multilevel bilateral foraminal stenosis. Also with DDD L4-L5 and L5-S1.   LBP likely from underlying spondylosis/DDD.   Previous thoracic MRI looked good. Note made of disc at C6-C7- he has no neck or arm pain, no scapular pain.   Treatment options discussed with patient and following plan made:   - No improvement in pain with time, medications, or 6 weeks of PT. He would like to see Dr. Myer Haff to see if he has any surgical options for his back.  - Will get updated PT note from [facility].  - Note for work keeping him out from today until his follow up with Dr. Myer Haff on 04/15/23.  - Results of MRI pelvis reviewed with him. Lesion appears benign. Repeat pelvic MRI with and without contrast ordered to be done in 6 months. We will call him.    Drake Leach PA-C Dept. of Neurosurgery

## 2023-04-02 ENCOUNTER — Encounter: Payer: Self-pay | Admitting: Orthopedic Surgery

## 2023-04-02 ENCOUNTER — Ambulatory Visit (INDEPENDENT_AMBULATORY_CARE_PROVIDER_SITE_OTHER): Payer: BC Managed Care – PPO | Admitting: Orthopedic Surgery

## 2023-04-02 DIAGNOSIS — M5136 Other intervertebral disc degeneration, lumbar region with discogenic back pain only: Secondary | ICD-10-CM | POA: Diagnosis not present

## 2023-04-02 DIAGNOSIS — M47816 Spondylosis without myelopathy or radiculopathy, lumbar region: Secondary | ICD-10-CM

## 2023-04-02 DIAGNOSIS — Z9889 Other specified postprocedural states: Secondary | ICD-10-CM | POA: Diagnosis not present

## 2023-04-02 DIAGNOSIS — M48061 Spinal stenosis, lumbar region without neurogenic claudication: Secondary | ICD-10-CM | POA: Diagnosis not present

## 2023-04-02 DIAGNOSIS — M899 Disorder of bone, unspecified: Secondary | ICD-10-CM

## 2023-04-02 NOTE — Progress Notes (Signed)
 Referring Physician:  Duanne Limerick, MD [address] [place],  [place] [address]  Primary Physician:  Duanne Limerick, MD  History of Present Illness: 04/15/2023 [name] is here today with a chief complaint of right lower back pain.  We have been seeing him for several years with right lower back pain in a similar location just above into the right of his incision from his microdiscectomy.  He has tried physical therapy in addition to medication management for quite some time.  He has previously tried a spinal cord stimulator evaluation without improvement.    History of Present Illness: 04/02/2023 Note from Drake Leach  DOS: 05/28/17 - R L4-5 microdiscectomy by Dr. Myer Haff DOS: 09/06/19 - SCS trial with percutaneous lead by Dr. Adriana Simas   [name] has a history of chronic pain syndrome and ED.    Last seen by me on 03/06/23. He has known lumbar spondylosis and multilevel bilateral foraminal stenosis. Also with DDD L4-L5 and L5-S1.    LBP likely from underlying spondylosis/DDD.    Previous thoracic MRI looked good. Note made of disc at C6-C7- he has no neck or arm pain, no scapular pain.    MRI showed lesion at left iliac wing and MRI pelvis with and without contrast was recommended.    He is here to review that imaging. He was to continue with PT for his lumbar spine.    He continues with constant right sided LBP with no leg pain. No numbness, tingling, or weakness. He has not see any significant improvement with over 6 weeks of PT. This pain has been constant since his visit in December.    He has occasional cigar- maybe twice a month. Does not smoke cigarettes.    He tried to go back to work yesterday and was unable to do this due to pain.    No bowel or bladder issues.    Conservative measures:  Physical therapy: initial eval with Roseanne Reno PT 02/17/23 and is still going  Multimodal medical therapy including regular antiinflammatories: robaxin,  aleve Injections: no epidural steroid injections   Past Surgery:  05/28/17 - R L4-5 microdiscectomy by Dr. Myer Haff 09/06/19 - SCS trial with percutaneous lead by Dr. Michaelene Song has no symptoms of cervical myelopathy.    The symptoms are causing a significant impact on the patient's life.   I have utilized the care everywhere function in epic to review the outside records available from external health systems.  Review of Systems:  A 10 point review of systems is negative, except for the pertinent positives and negatives detailed in the HPI.  Past Medical History: Past Medical History:  Diagnosis Date   Bulging lumbar disc    Chronic pain    low back   DDD (degenerative disc disease), lumbar    History of kidney stones     Past Surgical History: Past Surgical History:  Procedure Laterality Date   LUMBAR LAMINECTOMY/DECOMPRESSION MICRODISCECTOMY Right 05/28/2017   Procedure: LUMBAR LAMINECTOMY/DECOMPRESSION MICRODISCECTOMY 1 LEVEL-L4-5;  Surgeon: Venetia Night, MD;  Location: [facility] OR[facility];  Service: Neurosurgery;  Laterality: Right;   SPINAL CORD STIMULATOR TRIAL Bilateral 09/06/2019   Procedure: THORACIC SPINAL CORD STIMULATOR PERCUTANEOUS TRIAL;  Surgeon: Lucy Chris, MD;  Location: [facility];  Service: Neurosurgery;  Laterality: Bilateral;    Allergies: Allergies as of 04/15/2023   (No Known Allergies)    Medications:  Current Outpatient Medications:    diclofenac Sodium (VOLTAREN ARTHRITIS  PAIN) 1 % GEL, Apply 4 g topically 4 (four) times daily., Disp: 150 g, Rfl: 0   methocarbamol (ROBAXIN) 750 MG tablet, TAKE 1 TABLET (750 MG TOTAL) BY MOUTH EVERY 8 (EIGHT) HOURS AS NEEDED FOR MUSCLE SPASMS. THIS CAN MAKE YOU SLEEPY., Disp: 60 tablet, Rfl: 0   Multiple Vitamins-Minerals (MULTIVITAMIN PO), Take 10 tablets by mouth daily. , Disp: , Rfl:    tadalafil (CIALIS) 20 MG tablet, TAKE 1 TABLET BY MOUTH AS NEEDED, Disp: 18 tablet, Rfl: 11  Social History: Social  History   Tobacco Use   Smoking status: Some Days    Types: Cigars   Smokeless tobacco: Former    Types: Snuff   Tobacco comments:    counseled concerning meds and patches  Vaping Use   Vaping status: Never Used  Substance Use Topics   Alcohol use: Yes    Alcohol/week: 2.0 standard drinks of alcohol    Types: 2 Cans of beer per week   Drug use: No    Family Medical History: Family History  Family history unknown: Yes    Physical Examination: Vitals:   04/15/23 0958  BP: 136/84    General: Patient is in no apparent distress. Attention to examination is appropriate.  Neck:   Supple.  Full range of motion.  Respiratory: Patient is breathing without any difficulty.   NEUROLOGICAL:     Awake, alert, oriented to person, place, and time.  Speech is clear and fluent.   Cranial Nerves: Pupils equal round and reactive to light.  Facial tone is symmetric.  Facial sensation is symmetric. Shoulder shrug is symmetric. Tongue protrusion is midline.  There is no pronator drift.  Strength: Side Iliopsoas Quads Hamstring PF DF EHL  R 5 5 5 5 5 5   L 5 5 5 5 5 5     Bilateral upper and lower extremity sensation is intact to light touch.    No evidence of dysmetria noted.  Gait is normal.    Pain is in RLB just lateral and superior to his prior incision.  Medical Decision Making  Imaging: MRI L spine 02/24/2023 IMPRESSION: 1. Eccentric left disc bulge that results in mild narrowing of the left lateral recess, new/increased from 2020. 2. Moderate bilateral neural foraminal narrowing at L5-S1, unchanged. 3. Nonspecific T2 hyperintense lesion along the left iliac wing measuring 2.1 x 1.6 cm. This is only partially imaged in 2020, and is new compared to 2019. Recommend further evaluation with a pelvic MRI with and without contrast.     Electronically Signed   By: Lorenza Cambridge M.D.   On: 03/05/2023 10:15   L spine Flex Ext 01/21/2023 IMPRESSION: 1. No fracture or  acute finding.  No malalignment. 2. Lower lumbar spine disc degenerative changes as detailed. No subluxation with flexion or extension.     Electronically Signed   By: Amie Portland M.D.   On: 01/29/2023 14:44  I have personally reviewed the images and agree with the above interpretation.  Assessment and Plan: Mr. Dollinger is a pleasant 58 y.o. male with low back pain without radicular symptoms.  On his x-rays, he does have some retrolisthesis at L4-5.  I will reach out to the reading radiologist to see whether he feels that that would indicate any evidence of instability.  If he does not have instability, I do not feel that I have further options for him surgically.  If he does have instability, 1 could consider L4-5 fusion.  We discussed using Voltaren gel  to see if that will help symptomatically.  If he does not have instability, I will refer him to a pain psychologist.  I spent a total of 15 minutes in this patient's care today. This time was spent reviewing pertinent records including imaging studies, obtaining and confirming history, performing a directed evaluation, formulating and discussing my recommendations, and documenting the visit within the medical record.        Thank you for involving me in the care of this patient.      Kaidyn Javid K. Myer Haff MD, [facility] Neurosurgery

## 2023-04-14 ENCOUNTER — Ambulatory Visit: Payer: BC Managed Care – PPO | Admitting: Orthopedic Surgery

## 2023-04-15 ENCOUNTER — Ambulatory Visit (INDEPENDENT_AMBULATORY_CARE_PROVIDER_SITE_OTHER): Payer: BC Managed Care – PPO | Admitting: Neurosurgery

## 2023-04-15 ENCOUNTER — Encounter: Payer: Self-pay | Admitting: Neurosurgery

## 2023-04-15 VITALS — BP 136/84 | Ht 70.0 in | Wt 175.0 lb

## 2023-04-15 DIAGNOSIS — M545 Low back pain, unspecified: Secondary | ICD-10-CM | POA: Diagnosis not present

## 2023-04-15 DIAGNOSIS — M4310 Spondylolisthesis, site unspecified: Secondary | ICD-10-CM | POA: Diagnosis not present

## 2023-04-15 MED ORDER — DICLOFENAC SODIUM 1 % EX GEL: 4.0000 g | Freq: Four times a day (QID) | CUTANEOUS | 0 refills | Status: DC

## 2023-04-21 ENCOUNTER — Encounter: Payer: Self-pay | Admitting: Neurosurgery

## 2023-06-27 NOTE — Progress Notes (Signed)
 Referring Physician:  Clarise Crooks, MD [address] [place],  [place] [address]  Primary Physician:  Clarise Crooks, MD  History of Present Illness:  DOS: 05/28/17 - R L4-5 microdiscectomy by Dr. Mont Antis DOS: 09/06/19 - SCS trial with percutaneous lead by Dr. Debrah Fan  [name] has a history of chronic pain syndrome and ED.   Last seen by Dr. Mont Antis 04/15/23- he did not recommend further lumbar surgery. He did discuss possible referral to pain management. He has known lumbar spondylosis and multilevel bilateral foraminal stenosis. Also with DDD L4-L5 and L5-S1.   He is here for follow up.   He did a lot of yardwork over the weekend and had a flare up. He is feeling better and is back to his baseline of constant right sided lower lumbar pain. He has good days and bad days. No radiation of pain into ribs or into arms/legs. No numbness, tingling, or weakness.   He takes prn robaxin  and needs a refill.   No bowel or bladder issues.    Conservative measures:  Physical therapy: initial eval with Annette Barters PT 02/17/23 and was discharged on 05/15/23 after 10 visits.  Multimodal medical therapy including regular antiinflammatories: robaxin , aleve Injections: no epidural steroid injections  Past Surgery:  05/28/17 - R L4-5 microdiscectomy by Dr. Mont Antis 09/06/19 - SCS trial with percutaneous lead by Dr. Debrah Fan   Review of Systems:  A 10 point review of systems is negative, except for the pertinent positives and negatives detailed in the HPI.  Past Medical History: Past Medical History:  Diagnosis Date   Bulging lumbar disc    Chronic pain    low back   DDD (degenerative disc disease), lumbar    History of kidney stones     Past Surgical History: Past Surgical History:  Procedure Laterality Date   LUMBAR LAMINECTOMY/DECOMPRESSION MICRODISCECTOMY Right 05/28/2017   Procedure: LUMBAR LAMINECTOMY/DECOMPRESSION MICRODISCECTOMY 1 LEVEL-L4-5;  Surgeon: Jodeen Munch, MD;  Location: [facility];  Service: Neurosurgery;  Laterality: Right;   SPINAL CORD STIMULATOR TRIAL Bilateral 09/06/2019   Procedure: THORACIC SPINAL CORD STIMULATOR PERCUTANEOUS TRIAL;  Surgeon: Berta Brittle, MD;  Location: [facility];  Service: Neurosurgery;  Laterality: Bilateral;    Allergies: Allergies as of 07/01/2023   (No Known Allergies)    Medications: Outpatient Encounter Medications as of 07/01/2023  Medication Sig   diclofenac  Sodium (VOLTAREN  ARTHRITIS PAIN) 1 % GEL Apply 4 g topically 4 (four) times daily.   methocarbamol  (ROBAXIN ) 750 MG tablet TAKE 1 TABLET (750 MG TOTAL) BY MOUTH EVERY 8 (EIGHT) HOURS AS NEEDED FOR MUSCLE SPASMS. THIS CAN MAKE YOU SLEEPY.   Multiple Vitamins-Minerals (MULTIVITAMIN PO) Take 10 tablets by mouth daily.    tadalafil  (CIALIS ) 20 MG tablet TAKE 1 TABLET BY MOUTH AS NEEDED   No facility-administered encounter medications on file as of 07/01/2023.    Social History: Social History   Tobacco Use   Smoking status: Some Days    Types: Cigars   Smokeless tobacco: Former    Types: Snuff   Tobacco comments:    counseled concerning meds and patches  Vaping Use   Vaping status: Never Used  Substance Use Topics   Alcohol use: Yes    Alcohol/week: 2.0 standard drinks of alcohol    Types: 2 Cans of beer per week   Drug use: No    Family Medical History: Family History  Family history unknown: Yes    Physical Examination: There were no vitals  filed for this visit.  Awake, alert, oriented to person, place, and time.  Speech is clear and fluent. Fund of knowledge is appropriate.   Cranial Nerves: Pupils equal round and reactive to light.  Facial tone is symmetric.   No lower lumbar tenderness.    No abnormal lesions on exposed skin.   Strength: Side Iliopsoas Quads Hamstring PF DF EHL  R 5 5 5 5 5 5   L 5 5 5 5 5 5    Bilateral lower extremity sensation is intact to light touch.     Reflexes are 2+ and symmetric at the  patella and achilles.   Clonus is not present.    Gait is normal.     Medical Decision Making  Imaging: none  Assessment and Plan: Mr. Bena has constant right sided lower lumbar pain. He has good days and bad days. No radiation of pain into ribs or into arms/legs. No numbness, tingling, or weakness.   History of Right L4-5 microdiscectomy by Dr. Mont Antis on 05/28/17.   He has known lumbar spondylosis and multilevel bilateral foraminal stenosis. Also with DDD L4-L5 and L5-S1.   Treatment options discussed with patient and following plan made:   - He will continue with activity as tolerated.  - Refill on robaxin  to use prn. Reviewed dosing and side effects. He will need to be seen yearly to continue getting this from us .  - Discussed referral to pain psychologist- he declines.  - He is back at work. They will send me FMLA papers. Will write that he can be out total of 4 days per month as needed for back pain. Recommend this is good for a year.  - He will follow up prn. Of note, he is due for repeat MRI of pelvis with and without contrast in August.   I spent a total of 15 minutes in face-to-face and non-face-to-face activities related to this patient's care toda including review of outside records, review of imaging, review of symptoms, physical exam, discussion of differential diagnosis, discussion of treatment options, and documentation.   Lucetta Russel PA-C Dept. of Neurosurgery

## 2023-07-01 ENCOUNTER — Ambulatory Visit (INDEPENDENT_AMBULATORY_CARE_PROVIDER_SITE_OTHER): Admitting: Orthopedic Surgery

## 2023-07-01 ENCOUNTER — Encounter: Payer: Self-pay | Admitting: Orthopedic Surgery

## 2023-07-01 DIAGNOSIS — M5136 Other intervertebral disc degeneration, lumbar region with discogenic back pain only: Secondary | ICD-10-CM | POA: Diagnosis not present

## 2023-07-01 DIAGNOSIS — M47816 Spondylosis without myelopathy or radiculopathy, lumbar region: Secondary | ICD-10-CM

## 2023-07-01 DIAGNOSIS — Z9889 Other specified postprocedural states: Secondary | ICD-10-CM

## 2023-07-01 DIAGNOSIS — M48061 Spinal stenosis, lumbar region without neurogenic claudication: Secondary | ICD-10-CM

## 2023-07-01 DIAGNOSIS — Z09 Encounter for follow-up examination after completed treatment for conditions other than malignant neoplasm: Secondary | ICD-10-CM

## 2023-07-01 DIAGNOSIS — M545 Low back pain, unspecified: Secondary | ICD-10-CM

## 2023-07-01 MED ORDER — METHOCARBAMOL 750 MG PO TABS: 750.0000 mg | ORAL_TABLET | Freq: Three times a day (TID) | ORAL | 0 refills | Status: DC | PRN

## 2023-08-15 NOTE — Progress Notes (Signed)
 Referring Physician:  No referring provider defined for this encounter.  Primary Physician:  Maxwell Cathryne BROCKS, MD (Inactive)  History of Present Illness:  DOS: 05/28/17 - R L4-5 microdiscectomy by Dr. Clois DOS: 09/06/19 - SCS trial with percutaneous lead by Dr. Bluford  [name] has a history of chronic pain syndrome and ED.   Last seen by me on 07/01/23 and he was at his baseline of constant right sided lower lumbar pain. Dr. Clois did not recommend further lumbar surgery. He has known lumbar spondylosis and multilevel bilateral foraminal stenosis. Also with DDD L4-L5 and L5-S1.   He has been working on his house and having more pain. He is here for follow up.   2 week history of flare up of  intermittent right sided lower back pain. No leg pain. He no known injury. This is his normal pain, but it is more constant since flare up. Pain is worse with activity. He is still working. No numbness, tingling, or weakness.   He takes prn robaxin - he does not need a refill.   No bowel or bladder issues.    Conservative measures:  Physical therapy: initial eval with Maxwell [name] 02/17/23 and was discharged on 05/15/23 after 10 visits.  Multimodal medical therapy including regular antiinflammatories: robaxin , aleve Injections: no epidural steroid injections  Past Surgery:  05/28/17 - R L4-5 microdiscectomy by Dr. Clois 09/06/19 - SCS trial with percutaneous lead by Dr. Bluford   Review of Systems:  A 10 point review of systems is negative, except for the pertinent positives and negatives detailed in the HPI.  Past Medical History: Past Medical History:  Diagnosis Date   Bulging lumbar disc    Chronic pain    low back   DDD (degenerative disc disease), lumbar    History of kidney stones     Past Surgical History: Past Surgical History:  Procedure Laterality Date   LUMBAR LAMINECTOMY/DECOMPRESSION MICRODISCECTOMY Right 05/28/2017   Procedure: LUMBAR  LAMINECTOMY/DECOMPRESSION MICRODISCECTOMY 1 LEVEL-L4-5;  Surgeon: Maxwell [name] Fret, MD;  Location: [facility];  Service: Neurosurgery;  Laterality: Right;   SPINAL CORD STIMULATOR TRIAL Bilateral 09/06/2019   Procedure: THORACIC SPINAL CORD STIMULATOR PERCUTANEOUS TRIAL;  Surgeon: Maxwell Standing, MD;  Location: [facility];  Service: Neurosurgery;  Laterality: Bilateral;    Allergies: Allergies as of 08/25/2023   (No Known Allergies)    Medications: Outpatient Encounter Medications as of 08/25/2023  Medication Sig   diclofenac  Sodium (VOLTAREN  ARTHRITIS PAIN) 1 % GEL Apply 4 g topically 4 (four) times daily.   methocarbamol  (ROBAXIN ) 750 MG tablet Take 1 tablet (750 mg total) by mouth every 8 (eight) hours as needed for muscle spasms. This can make you sleepy.   Multiple Vitamins-Minerals (MULTIVITAMIN PO) Take 10 tablets by mouth daily.    tadalafil  (CIALIS ) 20 MG tablet TAKE 1 TABLET BY MOUTH AS NEEDED   No facility-administered encounter medications on file as of 08/25/2023.    Social History: Social History   Tobacco Use   Smoking status: Some Days    Types: Cigars   Smokeless tobacco: Former    Types: Snuff   Tobacco comments:    counseled concerning meds and patches  Vaping Use   Vaping status: Never Used  Substance Use Topics   Alcohol use: Yes    Alcohol/week: 2.0 standard drinks of alcohol    Types: 2 Cans of beer per week   Drug use: No    Family Medical History: Family History  Family history unknown:  Yes    Physical Examination: There were no vitals filed for this visit.  Awake, alert, oriented to person, place, and time.  Speech is clear and fluent. Fund of knowledge is appropriate.   Cranial Nerves: Pupils equal round and reactive to light.  Facial tone is symmetric.   Mild right sided mid to lower lumbar tenderness.   No abnormal lesions on exposed skin.   Strength: Side Iliopsoas Quads Hamstring PF DF EHL  R 5 5 5 5 5 5   L 5 5 5 5 5 5    Bilateral  lower extremity sensation is intact to light touch.     Reflexes are 2+ and symmetric at the patella and achilles.   Clonus is not present.    Gait is normal.     Medical Decision Making  Imaging: none  Assessment and Plan: [name]  has 2 week history of flare up of  intermittent right sided lower back pain. No leg pain. This is his normal pain, but it is more constant since flare up. No numbness, tingling, or weakness.   History of Right L4-5 microdiscectomy by Dr. Clois on 05/28/17.   He has known lumbar spondylosis and multilevel bilateral foraminal stenosis. Also with DDD L4-L5 and L5-S1.   Treatment options discussed with patient and following plan made:   - Medrol  dose pack for symptom relief. Reviewed dosing and side effects.  - Continue on prn robaxin . Reviewed dosing and side effects.  - Discussed referral to pain management to consider injections. He declines- he hates needles.  - Will pull him out of work due to flare up. Given note keeping him out until his follow up with me.  - Follow up with me in 2 weeks and prn.   I spent a total of 25 minutes in face-to-face and non-face-to-face activities related to this patient's care toda including review of outside records, review of imaging, review of symptoms, physical exam, discussion of differential diagnosis, discussion of treatment options, and documentation.   Maxwell Boys PA-C Dept. of Neurosurgery

## 2023-08-25 ENCOUNTER — Encounter: Payer: Self-pay | Admitting: Orthopedic Surgery

## 2023-08-25 ENCOUNTER — Ambulatory Visit (INDEPENDENT_AMBULATORY_CARE_PROVIDER_SITE_OTHER): Admitting: Orthopedic Surgery

## 2023-08-25 VITALS — BP 126/84 | Ht 70.0 in | Wt 175.0 lb

## 2023-08-25 DIAGNOSIS — M48061 Spinal stenosis, lumbar region without neurogenic claudication: Secondary | ICD-10-CM

## 2023-08-25 DIAGNOSIS — M5136 Other intervertebral disc degeneration, lumbar region with discogenic back pain only: Secondary | ICD-10-CM | POA: Diagnosis not present

## 2023-08-25 DIAGNOSIS — Z9889 Other specified postprocedural states: Secondary | ICD-10-CM

## 2023-08-25 DIAGNOSIS — M47816 Spondylosis without myelopathy or radiculopathy, lumbar region: Secondary | ICD-10-CM | POA: Diagnosis not present

## 2023-08-25 MED ORDER — METHYLPREDNISOLONE 4 MG PO TBPK: ORAL_TABLET | ORAL | 0 refills | Status: DC

## 2023-09-04 NOTE — Progress Notes (Signed)
 Referring Physician:  No referring provider defined for this encounter.  Primary Physician:  Joshua Cathryne BROCKS, MD (Inactive)  History of Present Illness:  DOS: 05/28/17 - R L4-5 microdiscectomy by Dr. Clois DOS: 09/06/19 - SCS trial with percutaneous lead by Dr. Bluford  [name] has a history of chronic pain syndrome and ED. He has known lumbar spondylosis and multilevel bilateral foraminal stenosis. Also with DDD L4-L5 and L5-S1.   Dr. Clois did not recommend further lumbar surgery.   Last seen by me on 08/25/23 with flare up of intermittent right sided LBP. He was given medrol  dose pack and taken out of work.   He did not see much relief with dose pack. He has good days and bad days, but is having more bad days lately.   He continues with intermittent right sided lower back pain. No leg pain. This is his normal pain, but it is more constant since recent flare up. Pain is worse with activity. No numbness, tingling, or weakness. He has been out of work- wants to go back next week.   He takes prn robaxin - he does not need a refill.   No bowel or bladder issues.    Conservative measures:  Physical therapy: initial eval with Jackquline PT 02/17/23 and was discharged on 05/15/23 after 10 visits.  Multimodal medical therapy including regular antiinflammatories: robaxin , aleve Injections: no epidural steroid injections  Past Surgery:  05/28/17 - R L4-5 microdiscectomy by Dr. Clois 09/06/19 - SCS trial with percutaneous lead by Dr. Bluford   Review of Systems:  A 10 point review of systems is negative, except for the pertinent positives and negatives detailed in the HPI.  Past Medical History: Past Medical History:  Diagnosis Date   Bulging lumbar disc    Chronic pain    low back   DDD (degenerative disc disease), lumbar    History of kidney stones     Past Surgical History: Past Surgical History:  Procedure Laterality Date   LUMBAR LAMINECTOMY/DECOMPRESSION  MICRODISCECTOMY Right 05/28/2017   Procedure: LUMBAR LAMINECTOMY/DECOMPRESSION MICRODISCECTOMY 1 LEVEL-L4-5;  Surgeon: Clois Fret, MD;  Location: [facility];  Service: Neurosurgery;  Laterality: Right;   SPINAL CORD STIMULATOR TRIAL Bilateral 09/06/2019   Procedure: THORACIC SPINAL CORD STIMULATOR PERCUTANEOUS TRIAL;  Surgeon: Bluford Standing, MD;  Location: [facility];  Service: Neurosurgery;  Laterality: Bilateral;    Allergies: Allergies as of 09/08/2023   (No Known Allergies)    Medications: Outpatient Encounter Medications as of 09/08/2023  Medication Sig   methocarbamol  (ROBAXIN ) 750 MG tablet Take 1 tablet (750 mg total) by mouth every 8 (eight) hours as needed for muscle spasms. This can make you sleepy.   Multiple Vitamins-Minerals (MULTIVITAMIN PO) Take 10 tablets by mouth daily.    tadalafil  (CIALIS ) 20 MG tablet TAKE 1 TABLET BY MOUTH AS NEEDED   [DISCONTINUED] methylPREDNISolone  (MEDROL  DOSEPAK) 4 MG TBPK tablet Use as directed x 6 days.   No facility-administered encounter medications on file as of 09/08/2023.    Social History: Social History   Tobacco Use   Smoking status: Some Days    Types: Cigars   Smokeless tobacco: Former    Types: Snuff   Tobacco comments:    counseled concerning meds and patches  Vaping Use   Vaping status: Never Used  Substance Use Topics   Alcohol use: Yes    Alcohol/week: 2.0 standard drinks of alcohol    Types: 2 Cans of beer per week   Drug use: No  Family Medical History: Family History  Family history unknown: Yes    Physical Examination: Vitals:   09/08/23 1116  BP: 124/84    Awake, alert, oriented to person, place, and time.  Speech is clear and fluent. Fund of knowledge is appropriate.   Cranial Nerves: Pupils equal round and reactive to light.  Facial tone is symmetric.   Mild right sided mid to lower lumbar tenderness.   No abnormal lesions on exposed skin.   Strength: Side Iliopsoas Quads Hamstring PF DF  EHL  R 5 5 5 5 5 5   L 5 5 5 5 5 5    Bilateral lower extremity sensation is intact to light touch.     Clonus is not present.    Gait is normal.     Medical Decision Making  Imaging: none  Assessment and Plan: Mr. Borman continues with more constant right sided lower back pain. No leg pain. This is his normal pain, but it is more constant since flare up. No numbness, tingling, or weakness.   History of Right L4-5 microdiscectomy by Dr. Clois on 05/28/17.   He has known lumbar spondylosis and multilevel bilateral foraminal stenosis. Also with DDD L4-L5 and L5-S1.   Treatment options discussed with patient and following plan made:   - He does not want to revisit PT.  - He is not interested in injections. He hates needles.  - Continue on prn robaxin . Reviewed dosing and side effects.  - He can return back to work on Monday 09/15/23. Given note.  - Follow up with me in 3 months. Will plan to send him MyChart message to review MRI results of pelvis.   I spent a total of 15 minutes in face-to-face and non-face-to-face activities related to this patient's care toda including review of outside records, review of imaging, review of symptoms, physical exam, discussion of differential diagnosis, discussion of treatment options, and documentation.   Glade Boys PA-C Dept. of Neurosurgery

## 2023-09-08 ENCOUNTER — Encounter: Payer: Self-pay | Admitting: Orthopedic Surgery

## 2023-09-08 ENCOUNTER — Ambulatory Visit (INDEPENDENT_AMBULATORY_CARE_PROVIDER_SITE_OTHER): Admitting: Orthopedic Surgery

## 2023-09-08 VITALS — BP 124/84 | Ht 70.0 in | Wt 175.0 lb

## 2023-09-08 DIAGNOSIS — M5136 Other intervertebral disc degeneration, lumbar region with discogenic back pain only: Secondary | ICD-10-CM | POA: Diagnosis not present

## 2023-09-08 DIAGNOSIS — M48061 Spinal stenosis, lumbar region without neurogenic claudication: Secondary | ICD-10-CM | POA: Diagnosis not present

## 2023-09-08 DIAGNOSIS — M5137 Other intervertebral disc degeneration, lumbosacral region with discogenic back pain only: Secondary | ICD-10-CM

## 2023-09-08 DIAGNOSIS — M47816 Spondylosis without myelopathy or radiculopathy, lumbar region: Secondary | ICD-10-CM | POA: Diagnosis not present

## 2023-09-08 DIAGNOSIS — Z9889 Other specified postprocedural states: Secondary | ICD-10-CM

## 2023-09-24 ENCOUNTER — Ambulatory Visit
Admission: EM | Admit: 2023-09-24 | Discharge: 2023-09-24 | Disposition: A | Discharge status: Home / Self Care | Attending: Family Medicine | Admitting: Family Medicine

## 2023-09-24 DIAGNOSIS — K529 Noninfective gastroenteritis and colitis, unspecified: Secondary | ICD-10-CM | POA: Diagnosis not present

## 2023-09-24 MED ORDER — ONDANSETRON 8 MG PO TBDP
8.0000 mg | ORAL_TABLET | Freq: Once | ORAL | Status: AC
  Administered 2023-09-24 (×2): 8 mg via ORAL

## 2023-09-24 MED ORDER — ONDANSETRON 4 MG PO TBDP: 4.0000 mg | ORAL_TABLET | Freq: Three times a day (TID) | ORAL | 0 refills | Status: DC | PRN

## 2023-09-24 NOTE — ED Triage Notes (Signed)
 Pt c/o emesis & diarrhea that started around 4 pm today. States had a house salad at home & sx's started after. Denies any abd pain. Had 4 episodes of emesis today. No OTC meds.

## 2023-09-24 NOTE — ED Provider Notes (Signed)
 [facility]    CSN: [id number] Arrival date & time: 09/24/23  1907      History   Chief Complaint Chief Complaint  Patient presents with   Diarrhea   Emesis    HPI Maxwell [name] is a 58 y.o. male.   HPI  [name] presents for abdominal discomfort, vomiting and diarrhea that started today after eating a salad at home around 4 PM. No one else has gotten sick but no one else has eaten the salad. He has had 4 episodes of non-bloody vomiting and 7 watery-non-bloody diarrhea. Nothing tried prior to arrival. Drank a sprite but that didn't stay down either.     Past Medical History:  Diagnosis Date   Bulging lumbar disc    Chronic pain    low back   DDD (degenerative disc disease), lumbar    History of kidney stones     Patient Active Problem List   Diagnosis Date Noted   S/P lumbar microdiscectomy 11/19/2018   Lumbar spondylosis 11/19/2018   Chronic pain syndrome 10/08/2018   Failed back surgical syndrome 10/08/2018   Lumbar post-laminectomy syndrome 10/08/2018   Degenerative disc disease at L5-S1 level 01/29/2017    Past Surgical History:  Procedure Laterality Date   LUMBAR LAMINECTOMY/DECOMPRESSION MICRODISCECTOMY Right 05/28/2017   Procedure: LUMBAR LAMINECTOMY/DECOMPRESSION MICRODISCECTOMY 1 LEVEL-L4-5;  Surgeon: Clois Fret, MD;  Location: [facility];  Service: Neurosurgery;  Laterality: Right;   SPINAL CORD STIMULATOR TRIAL Bilateral 09/06/2019   Procedure: THORACIC SPINAL CORD STIMULATOR PERCUTANEOUS TRIAL;  Surgeon: Bluford Standing, MD;  Location: [facility];  Service: Neurosurgery;  Laterality: Bilateral;       Home Medications    Prior to Admission medications   Medication Sig Start Date End Date Taking? Authorizing Provider  ondansetron  (ZOFRAN -ODT) 4 MG disintegrating tablet Take 1 tablet (4 mg total) by mouth every 8 (eight) hours as needed. 09/24/23  Yes Emmilynn Marut, DO  methocarbamol  (ROBAXIN ) 750 MG tablet Take 1 tablet (750 mg total)  by mouth every 8 (eight) hours as needed for muscle spasms. This can make you sleepy. 07/01/23   Hilma Hastings, PA-C  Multiple Vitamins-Minerals (MULTIVITAMIN PO) Take 10 tablets by mouth daily.     [provider]  tadalafil  (CIALIS ) 20 MG tablet TAKE 1 TABLET BY MOUTH AS NEEDED 01/05/23   Joshua Cathryne BROCKS, MD    Family History Family History  Family history unknown: Yes    Social History Social History   Tobacco Use   Smoking status: Some Days    Types: Cigars   Smokeless tobacco: Former    Types: Snuff   Tobacco comments:    counseled concerning meds and patches  Vaping Use   Vaping status: Never Used  Substance Use Topics   Alcohol use: Yes    Alcohol/week: 2.0 standard drinks of alcohol    Types: 2 Cans of beer per week   Drug use: No     Allergies   Patient has no known allergies.   Review of Systems Review of Systems :negative unless otherwise stated in HPI.      Physical Exam Triage Vital Signs ED Triage Vitals  Encounter Vitals Group     BP 09/24/23 1920 130/79     Girls Systolic BP Percentile --      Girls Diastolic BP Percentile --      Boys Systolic BP Percentile --      Boys Diastolic BP Percentile --      Pulse Rate 09/24/23 1920 63  Resp 09/24/23 1920 16     Temp 09/24/23 1920 98.2 F (36.8 C)     Temp Source 09/24/23 1920 Oral     SpO2 09/24/23 1920 98 %     Weight 09/24/23 1920 175 lb (79.4 kg)     Height 09/24/23 1920 5' 10 (1.778 m)     Head Circumference --      Peak Flow --      Pain Score 09/24/23 1923 0     Pain Loc --      Pain Education --      Exclude from Growth Chart --    No data found.  Updated Vital Signs BP 130/79 (BP Location: Left Arm)   Pulse 63   Temp 98.2 F (36.8 C) (Oral)   Resp 16   Ht 5' 10 (1.778 m)   Wt 79.4 kg   SpO2 98%   BMI 25.11 kg/m   Visual Acuity Right Eye Distance:   Left Eye Distance:   Bilateral Distance:    Right Eye Near:   Left Eye Near:    Bilateral Near:      Physical Exam  GEN: Nontoxic appearing male, in no acute distress  CV: regular rate and rhythm RESP: no increased work of breathing, clear to ascultation bilaterally ABD: Bowel sounds present. Soft, non-tender, non-distended.  No guarding, no rebound, no appreciable hepatosplenomegaly, no CVA tenderness, negative McBurney's, negative Murphy MSK: no extremity edema SKIN: warm, dry, no rash on visible skin NEURO: alert, moves all extremities appropriately   UC Treatments / Results  Labs (all labs ordered are listed, but only abnormal results are displayed) Labs Reviewed - No data to display  EKG  If EKG performed, see my interpretation and MDM section  Radiology No results found.   Procedures Procedures (including critical care time)  Medications Ordered in UC Medications  ondansetron  (ZOFRAN -ODT) disintegrating tablet 8 mg (8 mg Oral Given 09/24/23 1933)    Initial Impression / Assessment and Plan / UC Course  I have reviewed the triage vital signs and the nursing notes.  Pertinent labs & imaging results that were available during my care of the patient were reviewed by me and considered in my medical decision making (see chart for details).       Patient is a  58 y.o. male who presents after having insidious nausea, vomiting, diarrhea and abdominal pain about 3 hours ago.  Overall, patient is well-appearing, well-hydrated, and in no acute distress.  Vital signs stable.  Johnis afebrile.  Exam is  not concerning for an acute abdomen.  Patient given Zofran  and is tolerating p.o.  Prescribed Zofran  for home use.  Stressed importance of hydration.  Work note provided, patient's request   Follow-up, return and ED precautions given.  Discussed MDM, treatment plan and plan for follow-up with patient who agrees with plan.    Final Clinical Impressions(s) / UC Diagnoses   Final diagnoses:  Gastroenteritis     Discharge Instructions      Food poisoning is an illness  that is caused by eating or drinking contaminated foods or drinks. In most cases, food poisoning is mild and lasts 2-7 days. However, some cases can be serious, especially for people who have a weak body defense system (immune system), like older people, children and infants, and pregnant women.  See handout on food choices to help with diarrhea and vomiting. It is important that you stay hydrated. Stop by the pharmacy to pick up your prescriptions.  Follow up with your primary care provider or return to the urgent care, if not improving.       ED Prescriptions     Medication Sig Dispense Auth. Provider   ondansetron  (ZOFRAN -ODT) 4 MG disintegrating tablet Take 1 tablet (4 mg total) by mouth every 8 (eight) hours as needed. 20 tablet Rajni Holsworth, DO      PDMP not reviewed this encounter.   Micky Sheller, DO 10/02/23 1820

## 2023-09-24 NOTE — Discharge Instructions (Addendum)
 Food poisoning is an illness that is caused by eating or drinking contaminated foods or drinks. In most cases, food poisoning is mild and lasts 2-7 days. However, some cases can be serious, especially for people who have a weak body defense system (immune system), like older people, children and infants, and pregnant women.  See handout on food choices to help with diarrhea and vomiting. It is important that you stay hydrated. Stop by the pharmacy to pick up your prescriptions.  Follow up with your primary care provider or return to the urgent care, if not improving.

## 2023-09-30 ENCOUNTER — Ambulatory Visit
Admission: RE | Admit: 2023-09-30 | Discharge: 2023-09-30 | Disposition: A | Discharge status: Home / Self Care | Payer: BC Managed Care – PPO | Source: Ambulatory Visit | Attending: Orthopedic Surgery | Admitting: Orthopedic Surgery

## 2023-09-30 ENCOUNTER — Telehealth: Payer: Self-pay | Admitting: Orthopedic Surgery

## 2023-09-30 DIAGNOSIS — M51369 Other intervertebral disc degeneration, lumbar region without mention of lumbar back pain or lower extremity pain: Secondary | ICD-10-CM | POA: Diagnosis not present

## 2023-09-30 DIAGNOSIS — M899 Disorder of bone, unspecified: Secondary | ICD-10-CM | POA: Diagnosis not present

## 2023-09-30 NOTE — Telephone Encounter (Signed)
 He showed up for MRI of pelvis with and without contrast. He is refusing to get stuck for contrast.   He understands that an MRI with and without contrast will give radiologist the best picture. He still wants to get MRI of pelvis without contrast.   Order placed.

## 2023-10-02 ENCOUNTER — Ambulatory Visit: Admitting: Orthopedic Surgery

## 2023-10-16 ENCOUNTER — Encounter: Payer: Self-pay | Admitting: Orthopedic Surgery

## 2023-10-16 NOTE — Progress Notes (Signed)
 MRI Pelvis dated 09/30/23:  FINDINGS: Stable 1.6 x 1.6 x 1.2 cm lesion in the right iliac bone. This demonstrates stable areas of increased and decreased T1 and T2 signal intensity. The lesion is well-circumscribed and has a sharp zone of transition. No worrisome or aggressive imaging features. Findings are most consistent with either a benign fibroosseous lesion or a remote bone infarct.   There is a somewhat similar appearing much smaller lesion in the left iliac bone measuring approximately 5 mm also likely a benign bone infarct.   No worrisome lesions or new/progressive findings.   Stable degenerative disc disease involving the lower lumbar spine. Both hips are normally located. The pubic symphysis and SI joints are intact. No significant intrapelvic abnormalities are identified. No inguinal adenopathy or hernia.   IMPRESSION: 1. Stable 1.6 x 1.6 x 1.2 cm lesion in the right iliac bone. This is most consistent with either a benign fibroosseous lesion or a remote bone infarct. There is a somewhat similar appearing much smaller lesion in the left iliac bone measuring approximately 5 mm also likely a benign bone infarct. No worrisome lesions or new/progressive findings. 2. Stable degenerative disc disease involving the lower lumbar spine.     Electronically Signed   By: MYRTIS Stammer M.D.   On: 10/09/2023 18:37  Discussed above results with Dr. Stammer, he states that this does not require any further imaging evaluation.   Will review with patient at his follow up.

## 2023-11-02 NOTE — Progress Notes (Unsigned)
 Referring Physician:  No referring provider defined for this encounter.  Primary Physician:  Joshua Cathryne BROCKS, MD (Inactive)  History of Present Illness:  DOS: 05/28/17 - R L4-5 microdiscectomy by Dr. Clois DOS: 09/06/19 - SCS trial with percutaneous lead by Dr. Bluford  [name] has a history of chronic pain syndrome and ED. He has known lumbar spondylosis and multilevel bilateral foraminal stenosis. Also with DDD L4-L5 and L5-S1.   Dr. Clois did not recommend further lumbar surgery.   Last seen by me on 09/08/23 for recheck of back pain. He was to return to work on 09/15/23. He is here to review his pelvis MRI results.        He did not see much relief with dose pack. He has good days and bad days, but is having more bad days lately.   He continues with intermittent right sided lower back pain. No leg pain. This is his normal pain, but it is more constant since recent flare up. Pain is worse with activity. No numbness, tingling, or weakness. He has been out of work- wants to go back next week.   He takes prn robaxin - he does not need a refill.   No bowel or bladder issues.    Conservative measures:  Physical therapy: initial eval with [facility] 02/17/23 and was discharged on 05/15/23 after 10 visits.  Multimodal medical therapy including regular antiinflammatories: robaxin , aleve Injections: no epidural steroid injections  Past Surgery:  05/28/17 - R L4-5 microdiscectomy by Dr. Clois 09/06/19 - SCS trial with percutaneous lead by Dr. Bluford   Review of Systems:  A 10 point review of systems is negative, except for the pertinent positives and negatives detailed in the HPI.  Past Medical History: Past Medical History:  Diagnosis Date   Bulging lumbar disc    Chronic pain    low back   DDD (degenerative disc disease), lumbar    History of kidney stones     Past Surgical History: Past Surgical History:  Procedure Laterality Date   LUMBAR  LAMINECTOMY/DECOMPRESSION MICRODISCECTOMY Right 05/28/2017   Procedure: LUMBAR LAMINECTOMY/DECOMPRESSION MICRODISCECTOMY 1 LEVEL-L4-5;  Surgeon: Clois Fret, MD;  Location: [facility];  Service: Neurosurgery;  Laterality: Right;   SPINAL CORD STIMULATOR TRIAL Bilateral 09/06/2019   Procedure: THORACIC SPINAL CORD STIMULATOR PERCUTANEOUS TRIAL;  Surgeon: Bluford Standing, MD;  Location: [facility];  Service: Neurosurgery;  Laterality: Bilateral;    Allergies: Allergies as of 11/05/2023   (No Known Allergies)    Medications: Outpatient Encounter Medications as of 11/05/2023  Medication Sig   methocarbamol  (ROBAXIN ) 750 MG tablet Take 1 tablet (750 mg total) by mouth every 8 (eight) hours as needed for muscle spasms. This can make you sleepy.   Multiple Vitamins-Minerals (MULTIVITAMIN PO) Take 10 tablets by mouth daily.    ondansetron  (ZOFRAN -ODT) 4 MG disintegrating tablet Take 1 tablet (4 mg total) by mouth every 8 (eight) hours as needed.   tadalafil  (CIALIS ) 20 MG tablet TAKE 1 TABLET BY MOUTH AS NEEDED   No facility-administered encounter medications on file as of 11/05/2023.    Social History: Social History   Tobacco Use   Smoking status: Some Days    Types: Cigars   Smokeless tobacco: Former    Types: Snuff   Tobacco comments:    counseled concerning meds and patches  Vaping Use   Vaping status: Never Used  Substance Use Topics   Alcohol use: Yes    Alcohol/week: 2.0 standard drinks of alcohol  Types: 2 Cans of beer per week   Drug use: No    Family Medical History: Family History  Family history unknown: Yes    Physical Examination: There were no vitals filed for this visit.   Awake, alert, oriented to person, place, and time.  Speech is clear and fluent. Fund of knowledge is appropriate.   Cranial Nerves: Pupils equal round and reactive to light.  Facial tone is symmetric.   Mild right sided mid to lower lumbar tenderness.   No abnormal lesions on exposed  skin.   Strength: Side Iliopsoas Quads Hamstring PF DF EHL  R 5 5 5 5 5 5   L 5 5 5 5 5 5    Bilateral lower extremity sensation is intact to light touch.     Clonus is not present.    Gait is normal.     Medical Decision Making  Imaging: MRI Pelvis dated 09/30/23:  FINDINGS: Stable 1.6 x 1.6 x 1.2 cm lesion in the right iliac bone. This demonstrates stable areas of increased and decreased T1 and T2 signal intensity. The lesion is well-circumscribed and has a sharp zone of transition. No worrisome or aggressive imaging features. Findings are most consistent with either a benign fibroosseous lesion or a remote bone infarct.   There is a somewhat similar appearing much smaller lesion in the left iliac bone measuring approximately 5 mm also likely a benign bone infarct.   No worrisome lesions or new/progressive findings.   Stable degenerative disc disease involving the lower lumbar spine. Both hips are normally located. The pubic symphysis and SI joints are intact. No significant intrapelvic abnormalities are identified. No inguinal adenopathy or hernia.   IMPRESSION: 1. Stable 1.6 x 1.6 x 1.2 cm lesion in the right iliac bone. This is most consistent with either a benign fibroosseous lesion or a remote bone infarct. There is a somewhat similar appearing much smaller lesion in the left iliac bone measuring approximately 5 mm also likely a benign bone infarct. No worrisome lesions or new/progressive findings. 2. Stable degenerative disc disease involving the lower lumbar spine.     Electronically Signed   By: MYRTIS Stammer M.D.   On: 10/09/2023 18:37   I have personally reviewed the images and agree with the above interpretation.   Assessment and Plan: Maxwell [name] continues with more constant right sided lower back pain. No leg pain. This is his normal pain, but it is more constant since flare up. No numbness, tingling, or weakness.   History of Right L4-5  microdiscectomy by Dr. Clois on 05/28/17.   He has known lumbar spondylosis and multilevel bilateral foraminal stenosis. Also with DDD L4-L5 and L5-S1.   Discussed above results with Dr. Gallerani, he states that this does not require any further imaging evaluation.   Treatment options discussed with patient and following plan made:   - He does not want to revisit PT.  - He is not interested in injections. He hates needles.  - Continue on prn robaxin . Reviewed dosing and side effects.  - He can return back to work on Monday 09/15/23. Given note.  - Follow up with me in 3 months. Will plan to send him MyChart message to review MRI results of pelvis.   I spent a total of 15 minutes in face-to-face and non-face-to-face activities related to this patient's care toda including review of outside records, review of imaging, review of symptoms, physical exam, discussion of differential diagnosis, discussion of treatment options, and documentation.  Glade Boys PA-C Dept. of Neurosurgery

## 2023-11-05 ENCOUNTER — Encounter: Payer: Self-pay | Admitting: Orthopedic Surgery

## 2023-11-05 ENCOUNTER — Ambulatory Visit (INDEPENDENT_AMBULATORY_CARE_PROVIDER_SITE_OTHER): Admitting: Orthopedic Surgery

## 2023-11-05 VITALS — BP 146/88 | Ht 70.0 in | Wt 175.0 lb

## 2023-11-05 DIAGNOSIS — M5136 Other intervertebral disc degeneration, lumbar region with discogenic back pain only: Secondary | ICD-10-CM | POA: Diagnosis not present

## 2023-11-05 DIAGNOSIS — M48061 Spinal stenosis, lumbar region without neurogenic claudication: Secondary | ICD-10-CM

## 2023-11-05 DIAGNOSIS — Z9889 Other specified postprocedural states: Secondary | ICD-10-CM

## 2023-11-05 DIAGNOSIS — M47816 Spondylosis without myelopathy or radiculopathy, lumbar region: Secondary | ICD-10-CM | POA: Diagnosis not present

## 2023-11-05 DIAGNOSIS — M899 Disorder of bone, unspecified: Secondary | ICD-10-CM

## 2023-12-05 NOTE — Progress Notes (Signed)
 Referring Physician:  No referring provider defined for this encounter.  Primary Physician:  Maxwell Cathryne BROCKS, MD (Inactive)  History of Present Illness:  DOS: 05/28/17 - R L4-5 microdiscectomy by Dr. Clois DOS: 09/06/19 - SCS trial with percutaneous lead by Dr. Bluford  [name] has a history of chronic pain syndrome and ED.   He has known lumbar spondylosis and multilevel bilateral foraminal stenosis. Also with DDD L4-L5 and L5-S1.   Dr. Clois did not recommend further lumbar surgery.   Last seen by me on 11/05/23 to review his MRI pelvis. He was back at work.   He is here for follow up.   He is doing worse. He has more constant right sided lower back pain- pain is worse in the morning until he gets up and moving (around 11). He had pain getting in/out of his truck- he bought a new car. No leg pain. No numbness, tingling, or weakness.   He takes prn robaxin . Need a refill.   He is not doing well at work. Took a few days off in last 2 weeks.   No bowel or bladder issues.    Conservative measures:  Physical therapy: initial eval with Maxwell [name] 02/17/23 and was discharged on 05/15/23 after 10 visits.  Multimodal medical therapy including regular antiinflammatories: robaxin , aleve Injections: no epidural steroid injections  Past Surgery:  05/28/17 - R L4-5 microdiscectomy by Dr. Clois 09/06/19 - SCS trial with percutaneous lead by Dr. Bluford   Review of Systems:  A 10 point review of systems is negative, except for the pertinent positives and negatives detailed in the HPI.  Past Medical History: Past Medical History:  Diagnosis Date   Bulging lumbar disc    Chronic pain    low back   DDD (degenerative disc disease), lumbar    History of kidney stones     Past Surgical History: Past Surgical History:  Procedure Laterality Date   LUMBAR LAMINECTOMY/DECOMPRESSION MICRODISCECTOMY Right 05/28/2017   Procedure: LUMBAR LAMINECTOMY/DECOMPRESSION  MICRODISCECTOMY 1 LEVEL-L4-5;  Surgeon: Maxwell Fret, MD;  Location: [facility];  Service: Neurosurgery;  Laterality: Right;   SPINAL CORD STIMULATOR TRIAL Bilateral 09/06/2019   Procedure: THORACIC SPINAL CORD STIMULATOR PERCUTANEOUS TRIAL;  Surgeon: Maxwell Standing, MD;  Location: [facility];  Service: Neurosurgery;  Laterality: Bilateral;    Allergies: Allergies as of 12/08/2023   (No Known Allergies)    Medications: Outpatient Encounter Medications as of 12/08/2023  Medication Sig   Multiple Vitamins-Minerals (MULTIVITAMIN PO) Take 10 tablets by mouth daily.    ondansetron  (ZOFRAN -ODT) 4 MG disintegrating tablet Take 1 tablet (4 mg total) by mouth every 8 (eight) hours as needed.   tadalafil  (CIALIS ) 20 MG tablet TAKE 1 TABLET BY MOUTH AS NEEDED   [DISCONTINUED] methocarbamol  (ROBAXIN ) 750 MG tablet Take 1 tablet (750 mg total) by mouth every 8 (eight) hours as needed for muscle spasms. This can make you sleepy.   methocarbamol  (ROBAXIN ) 750 MG tablet Take 1 tablet (750 mg total) by mouth every 8 (eight) hours as needed for muscle spasms. This can make you sleepy.   No facility-administered encounter medications on file as of 12/08/2023.    Social History: Social History   Tobacco Use   Smoking status: Some Days    Types: Cigars   Smokeless tobacco: Former    Types: Snuff   Tobacco comments:    counseled concerning meds and patches  Vaping Use   Vaping status: Never Used  Substance Use Topics  Alcohol use: Yes    Alcohol/week: 2.0 standard drinks of alcohol    Types: 2 Cans of beer per week   Drug use: No    Family Medical History: Family History  Family history unknown: Yes    Physical Examination: Vitals:   12/08/23 1104  BP: 132/82    Awake, alert, oriented to person, place, and time.  Speech is clear and fluent. Fund of knowledge is appropriate.   Cranial Nerves: Pupils equal round and reactive to light.  Facial tone is symmetric.   No abnormal lesions  on exposed skin.   Strength: Side Iliopsoas Quads Hamstring PF DF EHL  R 5 5 5 5 5 5   L 5 5 5 5 5 5    Bilateral lower extremity sensation is intact to light touch.     Clonus is not present.    No pain with IR/ER of both hips.   Gait is normal.     Medical Decision Making  Imaging: none   Assessment and Plan: Maxwell [name] is doing worse. He has more constant right sided lower back pain- pain is worse in the morning until he gets up and moving. Pain also worse at work. No leg pain. No numbness, tingling, or weakness.   History of Right L4-5 microdiscectomy by Dr. Clois on 05/28/17.   He has known lumbar spondylosis and multilevel bilateral foraminal stenosis. Also with DDD L4-L5 and L5-S1.   Treatment options discussed with patient and following plan made:   - He does not want to revisit [name]. Continue HEP.  - He is not interested in injections. He hates needles.  - Continue on prn robaxin . Reviewed dosing and side effects. Refill given.  - He was given a note to be out of work pending his follow up with me on 12/30/23.   I spent a total of 15 minutes in face-to-face and non-face-to-face activities related to this patient's care toda including review of outside records, review of imaging, review of symptoms, physical exam, discussion of differential diagnosis, discussion of treatment options, and documentation.   Glade Boys PA-C Dept. of Neurosurgery

## 2023-12-08 ENCOUNTER — Encounter: Payer: Self-pay | Admitting: Orthopedic Surgery

## 2023-12-08 ENCOUNTER — Ambulatory Visit: Admitting: Orthopedic Surgery

## 2023-12-08 DIAGNOSIS — M545 Low back pain, unspecified: Secondary | ICD-10-CM

## 2023-12-08 DIAGNOSIS — M5136 Other intervertebral disc degeneration, lumbar region with discogenic back pain only: Secondary | ICD-10-CM | POA: Diagnosis not present

## 2023-12-08 DIAGNOSIS — M48061 Spinal stenosis, lumbar region without neurogenic claudication: Secondary | ICD-10-CM

## 2023-12-08 DIAGNOSIS — M5137 Other intervertebral disc degeneration, lumbosacral region with discogenic back pain only: Secondary | ICD-10-CM

## 2023-12-08 DIAGNOSIS — M47816 Spondylosis without myelopathy or radiculopathy, lumbar region: Secondary | ICD-10-CM

## 2023-12-08 DIAGNOSIS — Z9889 Other specified postprocedural states: Secondary | ICD-10-CM

## 2023-12-08 MED ORDER — METHOCARBAMOL 750 MG PO TABS: 750.0000 mg | ORAL_TABLET | Freq: Three times a day (TID) | ORAL | 0 refills | Status: DC | PRN

## 2023-12-29 NOTE — Progress Notes (Unsigned)
 Referring Physician:  No referring provider defined for this encounter.  Primary Physician:  No primary care provider on file.  History of Present Illness:  DOS: 05/28/17 - R L4-5 microdiscectomy by Dr. Clois DOS: 09/06/19 - SCS trial with percutaneous lead by Dr. Bluford  [name] has a history of chronic pain syndrome and ED.   He has known lumbar spondylosis and multilevel bilateral foraminal stenosis. Also with DDD L4-L5 and L5-S1.   Dr. Clois did not recommend further lumbar surgery.   Last seen by me on 12/08/23 and he was having more constant right sided LBP. No leg pain.   He was written out of work until today's visit.   He is here for follow up.   He had bad flare up of pain in last 4 days. No known injury. He has constant right sided lower back pain- pain is worse in the morning until he gets up and moving (around 11). No leg pain. He has pain with prolonged sitting/driving. No numbness, tingling, or weakness.   He takes prn robaxin .   No bowel or bladder issues.    Conservative measures:  Physical therapy: initial eval with [name] PT 02/17/23 and was discharged on 05/15/23 after 10 visits.  Multimodal medical therapy including regular antiinflammatories: robaxin , aleve Injections: no epidural steroid injections  Past Surgery:  05/28/17 - R L4-5 microdiscectomy by Dr. Clois 09/06/19 - SCS trial with percutaneous lead by Dr. Bluford   Review of Systems:  A 10 point review of systems is negative, except for the pertinent positives and negatives detailed in the HPI.  Past Medical History: Past Medical History:  Diagnosis Date   Bulging lumbar disc    Chronic pain    low back   DDD (degenerative disc disease), lumbar    History of kidney stones     Past Surgical History: Past Surgical History:  Procedure Laterality Date   LUMBAR LAMINECTOMY/DECOMPRESSION MICRODISCECTOMY Right 05/28/2017   Procedure: LUMBAR LAMINECTOMY/DECOMPRESSION  MICRODISCECTOMY 1 LEVEL-L4-5;  Surgeon: Clois Fret, MD;  Location: [facility];  Service: Neurosurgery;  Laterality: Right;   SPINAL CORD STIMULATOR TRIAL Bilateral 09/06/2019   Procedure: THORACIC SPINAL CORD STIMULATOR PERCUTANEOUS TRIAL;  Surgeon: Bluford Standing, MD;  Location: [facility];  Service: Neurosurgery;  Laterality: Bilateral;    Allergies: Allergies as of 12/30/2023   (No Known Allergies)    Medications: Outpatient Encounter Medications as of 12/30/2023  Medication Sig   methocarbamol  (ROBAXIN ) 750 MG tablet Take 1 tablet (750 mg total) by mouth every 8 (eight) hours as needed for muscle spasms. This can make you sleepy.   Multiple Vitamins-Minerals (MULTIVITAMIN PO) Take 10 tablets by mouth daily.    tadalafil  (CIALIS ) 20 MG tablet TAKE 1 TABLET BY MOUTH AS NEEDED   [DISCONTINUED] ondansetron  (ZOFRAN -ODT) 4 MG disintegrating tablet Take 1 tablet (4 mg total) by mouth every 8 (eight) hours as needed.   No facility-administered encounter medications on file as of 12/30/2023.    Social History: Social History   Tobacco Use   Smoking status: Some Days    Types: Cigars   Smokeless tobacco: Former    Types: Snuff   Tobacco comments:    counseled concerning meds and patches  Vaping Use   Vaping status: Never Used  Substance Use Topics   Alcohol use: Yes    Alcohol/week: 2.0 standard drinks of alcohol    Types: 2 Cans of beer per week   Drug use: No    Family Medical History: Family  History  Family history unknown: Yes    Physical Examination: Vitals:   12/30/23 0959  BP: 130/88     Awake, alert, oriented to person, place, and time.  Speech is clear and fluent. Fund of knowledge is appropriate.   Cranial Nerves: Pupils equal round and reactive to light.  Facial tone is symmetric.   No abnormal lesions on exposed skin.   Strength: Side Iliopsoas Quads Hamstring PF DF EHL  R 5 5 5 5 5 5   L 5 5 5 5 5 5    Bilateral lower extremity sensation is intact  to light touch.     Clonus is not present.    No pain with IR/ER of both hips.   Gait is normal.     Medical Decision Making  Imaging: none   Assessment and Plan: Mr. Gunby had a bad flare up of pain in last 4 days. No known injury. He has constant right sided lower back pain. No leg pain.  No numbness, tingling, or weakness.   History of Right L4-5 microdiscectomy by Dr. Clois on 05/28/17.   He has known lumbar spondylosis and multilevel bilateral foraminal stenosis. Also with DDD L4-L5 and L5-S1.   Treatment options discussed with patient and following plan made:   - He does not want to revisit PT. Continue HEP.  - He is not interested in injections. He hates needles.  - Continue on prn robaxin .  - He was given a note to be out of work pending his follow up with me on 01/28/24.   I spent a total of 15 minutes in face-to-face and non-face-to-face activities related to this patient's care toda including review of outside records, review of imaging, review of symptoms, physical exam, discussion of differential diagnosis, discussion of treatment options, and documentation.   Glade Boys PA-C Dept. of Neurosurgery

## 2023-12-30 ENCOUNTER — Ambulatory Visit (INDEPENDENT_AMBULATORY_CARE_PROVIDER_SITE_OTHER): Admitting: Orthopedic Surgery

## 2023-12-30 ENCOUNTER — Encounter: Payer: Self-pay | Admitting: Orthopedic Surgery

## 2023-12-30 VITALS — BP 130/88 | Ht 70.0 in | Wt 181.2 lb

## 2023-12-30 DIAGNOSIS — M48061 Spinal stenosis, lumbar region without neurogenic claudication: Secondary | ICD-10-CM

## 2023-12-30 DIAGNOSIS — M5136 Other intervertebral disc degeneration, lumbar region with discogenic back pain only: Secondary | ICD-10-CM | POA: Diagnosis not present

## 2023-12-30 DIAGNOSIS — M47816 Spondylosis without myelopathy or radiculopathy, lumbar region: Secondary | ICD-10-CM | POA: Diagnosis not present

## 2023-12-30 DIAGNOSIS — M5137 Other intervertebral disc degeneration, lumbosacral region with discogenic back pain only: Secondary | ICD-10-CM

## 2023-12-30 DIAGNOSIS — Z9889 Other specified postprocedural states: Secondary | ICD-10-CM

## 2024-01-06 ENCOUNTER — Other Ambulatory Visit: Payer: Self-pay | Admitting: Orthopedic Surgery

## 2024-01-06 DIAGNOSIS — M545 Low back pain, unspecified: Secondary | ICD-10-CM

## 2024-01-06 DIAGNOSIS — Z9889 Other specified postprocedural states: Secondary | ICD-10-CM

## 2024-01-19 DIAGNOSIS — Z9889 Other specified postprocedural states: Secondary | ICD-10-CM

## 2024-01-19 DIAGNOSIS — M961 Postlaminectomy syndrome, not elsewhere classified: Secondary | ICD-10-CM

## 2024-01-19 DIAGNOSIS — M47816 Spondylosis without myelopathy or radiculopathy, lumbar region: Secondary | ICD-10-CM

## 2024-01-20 NOTE — Addendum Note (Signed)
 Addended byBETHA HILMA HASTINGS on: 01/20/2024 09:01 PM   Modules accepted: Orders

## 2024-01-20 NOTE — Telephone Encounter (Signed)
 Discussed with Dr. Clois. Would need thoracic MRI and psych evaluation prior to referral to pain management.

## 2024-01-21 NOTE — Telephone Encounter (Signed)
 See his message, please send psych referral to [facility]. I put in order.   Please let him know when this is done and remind him that he will need to call them.

## 2024-01-21 NOTE — Telephone Encounter (Signed)
 Referral faxed

## 2024-01-23 NOTE — Progress Notes (Unsigned)
 Referring Physician:  No referring provider defined for this encounter.  Primary Physician:  No primary care provider on file.  History of Present Illness:  DOS: 05/28/17 - R L4-5 microdiscectomy by Dr. Clois DOS: 09/06/19 - SCS trial with percutaneous lead by Dr. Bluford  [name] has a history of chronic pain syndrome and ED.   He has known lumbar spondylosis and multilevel bilateral foraminal stenosis. Also with DDD L4-L5 and L5-S1.   Dr. Clois did not recommend further lumbar surgery.   Last seen by me on 12/08/23 and he was having more constant right sided LBP. No leg pain.   He called and wanted to proceed with SCS trial. Thoracic MRI was ordered along with psych evaluation (scheduled for today). He has been out of work until today's visit.   He is here for follow up.   He has good days and bad days. He did well on Sunday with minimal back pain. He had more constant left sided pain on Monday and Tuesday. Today pain is more on right side of his lower back. No leg pain. He has pain with prolonged sitting/driving for over an hour.   He takes prn robaxin .   No bowel or bladder issues.    Conservative measures:  Physical therapy: initial eval with Maxwell [name] 02/17/23 and was discharged on 05/15/23 after 10 visits.  Multimodal medical therapy including regular antiinflammatories: robaxin , aleve Injections: no epidural steroid injections  Past Surgery:  05/28/17 - R L4-5 microdiscectomy by Dr. Clois 09/06/19 - SCS trial with percutaneous lead by Dr. Bluford   Review of Systems:  A 10 point review of systems is negative, except for the pertinent positives and negatives detailed in the HPI.  Past Medical History: Past Medical History:  Diagnosis Date   Bulging lumbar disc    Chronic pain    low back   DDD (degenerative disc disease), lumbar    History of kidney stones     Past Surgical History: Past Surgical History:  Procedure Laterality Date   LUMBAR  LAMINECTOMY/DECOMPRESSION MICRODISCECTOMY Right 05/28/2017   Procedure: LUMBAR LAMINECTOMY/DECOMPRESSION MICRODISCECTOMY 1 LEVEL-L4-5;  Surgeon: Maxwell Fret, MD;  Location: [facility] ORS;  Service: Neurosurgery;  Laterality: Right;   SPINAL CORD STIMULATOR TRIAL Bilateral 09/06/2019   Procedure: THORACIC SPINAL CORD STIMULATOR PERCUTANEOUS TRIAL;  Surgeon: Maxwell Standing, MD;  Location: [facility] ORS;  Service: Neurosurgery;  Laterality: Bilateral;    Allergies: Allergies as of 01/28/2024   (No Known Allergies)    Medications: Outpatient Encounter Medications as of 01/28/2024  Medication Sig   methocarbamol  (ROBAXIN ) 750 MG tablet TAKE 1 TABLET BY MOUTH EVERY 8 (EIGHT) HOURS AS NEEDED FOR MUSCLE SPASMS. THIS CAN MAKE YOU SLEEPY.   Multiple Vitamins-Minerals (MULTIVITAMIN PO) Take 10 tablets by mouth daily.    tadalafil  (CIALIS ) 20 MG tablet TAKE 1 TABLET BY MOUTH AS NEEDED   No facility-administered encounter medications on file as of 01/28/2024.    Social History: Social History   Tobacco Use   Smoking status: Some Days    Types: Cigars   Smokeless tobacco: Former    Types: Snuff   Tobacco comments:    counseled concerning meds and patches  Vaping Use   Vaping status: Never Used  Substance Use Topics   Alcohol use: Yes    Alcohol/week: 2.0 standard drinks of alcohol    Types: 2 Cans of beer per week   Drug use: No    Family Medical History: Family History  Family history unknown:  Yes    Physical Examination: Vitals:   01/28/24 1002  BP: 130/84   Awake, alert, oriented to person, place, and time.  Speech is clear and fluent. Fund of knowledge is appropriate.   Cranial Nerves: Pupils equal round and reactive to light.  Facial tone is symmetric.   No abnormal lesions on exposed skin.   Strength: Side Iliopsoas Quads Hamstring PF DF EHL  R 5 5 5 5 5 5   L 5 5 5 5 5 5    Bilateral lower extremity sensation is intact to light touch.     Clonus is not present.    No  pain with IR/ER of both hips.   Gait is normal.     Medical Decision Making  Imaging: none   Assessment and Plan: [name] is having more persistent LBP, right > left side. No leg pain. No leg pain.  No numbness, tingling, or weakness.   History of Right L4-5 microdiscectomy by Dr. Clois on 05/28/17.   He has known lumbar spondylosis and multilevel bilateral foraminal stenosis. Also with DDD L4-L5 and L5-S1.   Treatment options discussed with patient and following plan made:   - He is interested in SCS trial- has psych eval today and thoracic MRI tomorrow.  - Will plan for phone/mychart visit to review thoracic MRI.  - Will put in referral for Dr. Marcelino to work on scheduling for SCS trial.  - Continue on prn robaxin .  - He was given a note to be out of work until 03/15/24.   I spent a total of 15 minutes in face-to-face and non-face-to-face activities related to this patient's care toda including review of outside records, review of imaging, review of symptoms, physical exam, discussion of differential diagnosis, discussion of treatment options, and documentation.   Maxwell Boys PA-C Dept. of Neurosurgery

## 2024-01-28 ENCOUNTER — Ambulatory Visit: Admitting: Orthopedic Surgery

## 2024-01-28 ENCOUNTER — Encounter: Payer: Self-pay | Admitting: Orthopedic Surgery

## 2024-01-28 VITALS — BP 130/84 | Wt 181.0 lb

## 2024-01-28 DIAGNOSIS — M5136 Other intervertebral disc degeneration, lumbar region with discogenic back pain only: Secondary | ICD-10-CM

## 2024-01-28 DIAGNOSIS — M47816 Spondylosis without myelopathy or radiculopathy, lumbar region: Secondary | ICD-10-CM | POA: Diagnosis not present

## 2024-01-28 DIAGNOSIS — M48061 Spinal stenosis, lumbar region without neurogenic claudication: Secondary | ICD-10-CM | POA: Diagnosis not present

## 2024-01-28 DIAGNOSIS — Z9889 Other specified postprocedural states: Secondary | ICD-10-CM

## 2024-01-28 NOTE — Patient Instructions (Signed)
 It was so nice to see you today. Thank you so much for coming in.    I want you to see pain management here in [place] (Dr. Marcelino) to discuss possible spinal cord stimulator trial. They should call you to schedule an appointment or you can call them at [phone].   Once I have results from thoracic MRI, we will call you to schedule a follow up. Can do a phone or MyChart visit.   I gave you a work note.   Please do not hesitate to call if you have any questions or concerns. You can also message me in MyChart.   Glade Boys PA-C [phone]     The physicians and staff at [facility] Neurosurgery at [facility] are committed to providing excellent care. You may receive a survey asking for feedback about your experience at our office. We value you your feedback and appreciate you taking the time to to fill it out. The [facility] leadership team is also available to discuss your experience in person, feel free to contact us  [phone].

## 2024-01-29 ENCOUNTER — Inpatient Hospital Stay
Admission: RE | Admit: 2024-01-29 | Discharge: 2024-01-29 | Attending: Orthopedic Surgery | Admitting: Orthopedic Surgery

## 2024-01-29 DIAGNOSIS — M961 Postlaminectomy syndrome, not elsewhere classified: Secondary | ICD-10-CM

## 2024-01-29 DIAGNOSIS — M47816 Spondylosis without myelopathy or radiculopathy, lumbar region: Secondary | ICD-10-CM

## 2024-01-29 DIAGNOSIS — M5124 Other intervertebral disc displacement, thoracic region: Secondary | ICD-10-CM | POA: Diagnosis not present

## 2024-01-29 DIAGNOSIS — Z9889 Other specified postprocedural states: Secondary | ICD-10-CM

## 2024-02-13 NOTE — Progress Notes (Signed)
 "  Telephone Visit- Progress Note: Referring Physician:  No referring provider defined for this encounter.  Primary Physician:  No primary care provider on file.  This visit was performed via telephone.  Patient location: home Provider location: office  I spent a total of 5 minutes non-face-to-face activities for this visit on the date of this encounter including review of current clinical condition and response to treatment.    Patient has given verbal consent to this telephone visits and we reviewed the limitations of a telephone visit. Patient wishes to proceed.    Chief Complaint:  review thoracic MRI results  History of Present Illness:  DOS: 05/28/17 - R L4-5 microdiscectomy by Dr. Clois DOS: 09/06/19 - SCS trial with percutaneous lead by Dr. Bluford   Mr. Hulet Ehrmann has a history of chronic pain syndrome and ED.    He has known lumbar spondylosis and multilevel bilateral foraminal stenosis. Also with DDD L4-L5 and L5-S1.    Dr. Clois did not recommend further lumbar surgery.    Last seen by me on 12/08/23 and he was having more constant right sided LBP. No leg pain. Thoracic MRI ordered along with psych evaluation.   Phone visit scheduled to review his thoracic MRI.   He has good days and bad days. He has more constant right sided LBP with no leg pain. He has pain with prolonged sitting/driving for over an hour.   Did psych evaluation.    He takes prn robaxin .    No bowel or bladder issues.      Conservative measures:  Physical therapy: initial eval with [facility] 02/17/23 and was discharged on 05/15/23 after 10 visits.  Multimodal medical therapy including regular antiinflammatories: robaxin , aleve Injections: no epidural steroid injections   Past Surgery:  05/28/17 - R L4-5 microdiscectomy by Dr. Clois 09/06/19 - SCS trial with percutaneous lead by Dr. Bluford   Exam: No exam done as this was a telephone encounter.     Imaging: Thoracic MRI dated  01/29/24:  FINDINGS:   BONES AND ALIGNMENT: Normal alignment. Normal vertebral body heights. Marrow signal is unremarkable.   SPINAL CORD: Unchanged mildly small caliber of the thoracic spinal cord diffusely. Normal signal.   SOFT TISSUES: Unremarkable.   DEGENERATIVE CHANGES: C7-T1: Negative. T1-T2: Negative. T2-T3: Negative. T3-T4: Tiny central disc protrusion without stenosis, unchanged. T4-T5: Tiny central disc protrusion without stenosis, unchanged. T5-T6: Negative. T6-T7: Small central disc protrusion without stenosis, unchanged. T7-T8: Small central disc protrusion without stenosis, unchanged. T8-T9: Small left paracentral disc protrusion without stenosis, unchanged. T9-T10: Negative. T10-T11: Negative. T11-T12: Small right paracentral disc protrusion without stenosis, unchanged. T12-L1: Negative.   IMPRESSION: 1. Unchanged small thoracic disc protrusions without stenosis.   Electronically signed by: Dasie Hamburg MD 02/02/2024 11:46 AM EST RP Workstation: [id number]    I have personally reviewed the images and agree with the above interpretation.  Assessment and Plan: Mr. Canal has good days and bad days. He has more constant right sided LBP with no leg pain. He has pain with prolonged sitting/driving for over an hour.    History of Right L4-5 microdiscectomy by Dr. Clois on 05/28/17.    He has known lumbar spondylosis and multilevel bilateral foraminal stenosis. Also with DDD L4-L5 and L5-S1.   Thoracic MRI shows spondylosis with no stenosis.   He completed his psych evaluation.    Treatment options discussed with patient and following plan made:   - He will call Dr. Charolotte office regarding his referral for SCS trial (  likely will need to be open paddle).  - Continue on prn robaxin .  - He was given a note to be out of work until 03/15/24. He will updated me on work through [employer].   Glade Boys PA-C Neurosurgery "

## 2024-02-18 ENCOUNTER — Encounter: Payer: Self-pay | Admitting: Orthopedic Surgery

## 2024-02-18 ENCOUNTER — Ambulatory Visit: Admitting: Orthopedic Surgery

## 2024-02-18 DIAGNOSIS — M47814 Spondylosis without myelopathy or radiculopathy, thoracic region: Secondary | ICD-10-CM

## 2024-02-18 DIAGNOSIS — M48061 Spinal stenosis, lumbar region without neurogenic claudication: Secondary | ICD-10-CM

## 2024-02-18 DIAGNOSIS — M5136 Other intervertebral disc degeneration, lumbar region with discogenic back pain only: Secondary | ICD-10-CM

## 2024-02-18 DIAGNOSIS — M47816 Spondylosis without myelopathy or radiculopathy, lumbar region: Secondary | ICD-10-CM | POA: Diagnosis not present

## 2024-02-18 DIAGNOSIS — M961 Postlaminectomy syndrome, not elsewhere classified: Secondary | ICD-10-CM | POA: Diagnosis not present

## 2024-02-18 DIAGNOSIS — Z9889 Other specified postprocedural states: Secondary | ICD-10-CM

## 2024-02-24 ENCOUNTER — Other Ambulatory Visit: Payer: Self-pay | Admitting: Student in an Organized Health Care Education/Training Program

## 2024-02-24 ENCOUNTER — Ambulatory Visit
Admission: RE | Admit: 2024-02-24 | Discharge: 2024-02-24 | Disposition: A | Discharge status: Home / Self Care | Source: Ambulatory Visit | Attending: Student in an Organized Health Care Education/Training Program | Admitting: Student in an Organized Health Care Education/Training Program

## 2024-02-24 ENCOUNTER — Encounter: Payer: Self-pay | Admitting: Student in an Organized Health Care Education/Training Program

## 2024-02-24 ENCOUNTER — Ambulatory Visit (HOSPITAL_BASED_OUTPATIENT_CLINIC_OR_DEPARTMENT_OTHER): Admitting: Student in an Organized Health Care Education/Training Program

## 2024-02-24 VITALS — BP 119/81 | HR 72 | Temp 98.2°F | Resp 14 | Ht 70.0 in | Wt 181.0 lb

## 2024-02-24 DIAGNOSIS — M961 Postlaminectomy syndrome, not elsewhere classified: Secondary | ICD-10-CM | POA: Diagnosis not present

## 2024-02-24 DIAGNOSIS — G894 Chronic pain syndrome: Secondary | ICD-10-CM | POA: Diagnosis not present

## 2024-02-24 NOTE — Progress Notes (Signed)
 Safety precautions to be maintained throughout the outpatient stay will include: orient to surroundings, keep bed in low position, maintain call bell within reach at all times, provide assistance with transfer out of bed and ambulation.

## 2024-02-24 NOTE — Progress Notes (Signed)
 PROVIDER NOTE: Interpretation of information contained herein should be left to medically-trained personnel. Specific patient instructions are provided elsewhere under Patient Instructions section of medical record. This document was created in part using AI and STT-dictation technology, any transcriptional errors that may result from this process are unintentional.  Patient: Maxwell [name]  Service: E/M Encounter  Provider: Wallie Sherry, MD  DOB: [birth year]  Delivery: Face-to-face  Specialty: Interventional Pain Management  MRN: [id number]  Setting: Ambulatory outpatient facility  Specialty designation: 09  Type: New Patient  Location: Outpatient office facility  PCP: No primary care provider on file.  DOS: 02/24/2024    Referring Prov.: Hilma Hastings, PA-C   Primary Reason(s) for Visit: Encounter for initial evaluation of one or more chronic problems (new to examiner) potentially causing chronic pain, and posing a threat to normal musculoskeletal function. (Level of risk: High) CC: Back Pain (Lower right )  HPI  Maxwell [name] is a 59 y.o. year old, male patient, who comes for the first time to our practice referred by Hilma Hastings, PA-C for our initial evaluation of his chronic pain. He has Chronic pain syndrome; Failed back surgical syndrome; Lumbar post-laminectomy syndrome; S/P lumbar microdiscectomy; Lumbar spondylosis; and Degenerative disc disease at L5-S1 level on their problem list. Today he comes in for evaluation of his Back Pain (Lower right )  Pain Assessment: Location: Lower, Right Back Radiating: Denies Onset: More than a month ago Duration: Chronic pain Quality: Aching, Stabbing Severity: 6 /10 (subjective, self-reported pain score)  Effect on ADL: Limits ADLs Timing: Constant Modifying factors: Resting and heating pad BP: 119/81  HR: 72  Onset and Duration: Present longer than 3 months Cause of pain: Unknown Severity: Getting worse Timing: Morning Aggravating Factors:  Lifiting Alleviating Factors: Hot packs, Lying down, and Resting Associated Problems: Pain that wakes patient up Quality of Pain: Stabbing Previous Examinations or Tests: MRI scan Previous Treatments: Stretching exercises  Maxwell [name] is being evaluated for possible interventional pain management therapies for the treatment of his chronic pain.   Patient is a 59 year old male who presents with a chief complaint of low back pain that is more pronounced on the right side.  History of right L4-L5 microdiscectomy with Dr. Katrina followed by a percutaneous spinal cord stim trial with Dr. Bluford 09/06/2019.  He continues to endorse right lower back pain with no radiation into his leg.  He takes Robaxin  as needed.  He is being referred here for a repeat spinal cord stimulator trial.  The patient clearly informed to me that he does not do needles  Meds  Current Medications[1]  Imaging Review   MR THORACIC SPINE WO CONTRAST  Narrative EXAM: MR Thoracic Spine without 01/29/2024 07:49:59 AM  TECHNIQUE: Multiplanar multisequence MRI of the thoracic spine was performed without the administration of intravenous contrast.  COMPARISON: MR Thoracic Spine 03/17/2022  CLINICAL HISTORY: Chronic pain syndrome; multilevel bilateral foraminal stenosis; Degenerative disc disease L4-L5 and L5-S1; Status post lumbar microdiscectomy; Lumbar post-laminectomy syndrome; Lumbar spondylosis. Evaluation for spinal cord stimulator trial.  FINDINGS:  BONES AND ALIGNMENT: Normal alignment. Normal vertebral body heights. Marrow signal is unremarkable.  SPINAL CORD: Unchanged mildly small caliber of the thoracic spinal cord diffusely. Normal signal.  SOFT TISSUES: Unremarkable.  DEGENERATIVE CHANGES: C7-T1: Negative. T1-T2: Negative. T2-T3: Negative. T3-T4: Tiny central disc protrusion without stenosis, unchanged. T4-T5: Tiny central disc protrusion without stenosis,  unchanged. T5-T6: Negative. T6-T7: Small central disc protrusion without stenosis, unchanged. T7-T8: Small central disc protrusion without stenosis, unchanged. T8-T9: Small  left paracentral disc protrusion without stenosis, unchanged. T9-T10: Negative. T10-T11: Negative. T11-T12: Small right paracentral disc protrusion without stenosis, unchanged. T12-L1: Negative.  IMPRESSION: 1. Unchanged small thoracic disc protrusions without stenosis.  Electronically signed by: Dasie Hamburg MD 02/02/2024 11:46 AM EST RP Workstation: [id number]   DG Thoracic Spine 2 View  Narrative CLINICAL DATA:  Thoracic pain 2 months.  No injury.  EXAM: THORACIC SPINE 2 VIEWS  COMPARISON:  07/01/2012  FINDINGS: Vertebral body alignment, heights and disc spaces are normal. Pedicles are intact. No compression fracture or significant degenerative changes.  IMPRESSION: Negative.   Electronically Signed By: Toribio Agreste M.D. On: 01/21/2022 16:54  Thoracic DG 4 views: No results found for this or any previous visit.  Thoracic DG: No results found for this or any previous visit.  Thoracic DG w/swimmers view: No results found for this or any previous visit.  Thoracic DG Myelogram views: No results found for this or any previous visit.  Thoracic DG Myelogram views: No results found for this or any previous visit.   Lumbosacral Imaging: Lumbar MR wo contrast: Results for orders placed during the hospital encounter of 02/24/23  MR LUMBAR SPINE WO CONTRAST  Narrative CLINICAL DATA:  Low back pain, prior surgery, new symptoms  EXAM: MRI LUMBAR SPINE WITHOUT CONTRAST  TECHNIQUE: Multiplanar, multisequence MR imaging of the lumbar spine was performed. No intravenous contrast was administered.  COMPARISON:  Lumbar spine MRI 10/29/2018, 02/28/2017  FINDINGS: Segmentation:  Standard.  Alignment:  Mild retrolisthesis of L4 on L5 and L5 on S1.  Vertebrae: No fracture, evidence of  discitis, or bone lesion. Degenerative endplate changes of the inferior and superior endplates of L5 on S1.  Conus medullaris and cauda equina: Conus extends to the T12-L1 disc space level. Conus and cauda equina appear normal.  Paraspinal and other soft tissues: There is nonspecific T2 hyperintense lesion along the left iliac wing measuring 2.1 x 1.6 cm (series 8, image 35). This is only partially imaged in 2020, and is new compared to 2019.  Disc levels:  T12-L1: Unremarkable  L1-L2: Unremarkable  L2-L3: Unremarkable  L3-L4: Mild bilateral facet degenerative change. No significant disc bulge. No spinal canal narrowing. Mild bilateral neural foraminal narrowing.  L4-L5: Right hemilaminectomy. Minimal disc bulge. Mild bilateral facet degenerative change. Mild narrowing of the right lateral recess. No significant spinal canal narrowing. Mild-to-moderate bilateral neural foraminal narrowing.  L5-S1: Circumferential disc bulge, slightly eccentric to the left. There is narrowing of the left lateral recess, new/increased from prior exam. No significant spinal canal narrowing. Mild bilateral facet degenerative change. Moderate bilateral neural foraminal narrowing.  IMPRESSION: 1. Eccentric left disc bulge that results in mild narrowing of the left lateral recess, new/increased from 2020. 2. Moderate bilateral neural foraminal narrowing at L5-S1, unchanged. 3. Nonspecific T2 hyperintense lesion along the left iliac wing measuring 2.1 x 1.6 cm. This is only partially imaged in 2020, and is new compared to 2019. Recommend further evaluation with a pelvic MRI with and without contrast.   Electronically Signed By: Lyndall Gore M.D. On: 03/05/2023 10:15    Narrative CLINICAL DATA:  Elective surgery  EXAM: LUMBAR SPINE - 2-3 VIEW; DG C-ARM 61-120 MIN  COMPARISON:  MRI 02/28/2017  FINDINGS: First lateral intraoperative spot image demonstrates posterior surgical  instrument along the posterior aspect of the L5 vertebral body.  Second and 3rd lateral intraoperative images demonstrate posterior surgical instruments directed at the superior aspect of L5.  IMPRESSION: Intraoperative localization as above.   Electronically Signed  By: Franky Crease M.D. On: 05/28/2017 08:35  Lumbar DG (Complete) 4+V: Results for orders placed during the hospital encounter of 01/21/23  DG Lumbar Spine Complete  Addendum 04/21/2023 10:17 AM ADDENDUM REPORT: 04/21/2023 10:14  ADDENDUM: Slight retrolisthesis, in retrospect, noted of L4 on L5 and L5 on S1, better appreciated on the MRI from 02/24/2023. This measures 2-3 mm. No convincing subluxation with flexion or extension at either level.   Electronically Signed By: Alm Parkins M.D. On: 04/21/2023 10:14  Narrative CLINICAL DATA:  Back pain. History of by a lumbar micro discectomy in 2019.  EXAM: LUMBAR SPINE - COMPLETE 4+ VIEW  COMPARISON:  Lumbar MRI, 10/29/2018.  FINDINGS: No fracture or bone lesion.  No spondylolisthesis.  Mild loss of disc height at L4-5 with moderate loss of disc height at L5-S1. Remaining disc spaces are well preserved.  No subluxation with flexion or extension.  Soft tissues are unremarkable.  IMPRESSION: 1. No fracture or acute finding.  No malalignment. 2. Lower lumbar spine disc degenerative changes as detailed. No subluxation with flexion or extension.  Electronically Signed: By: Alm Parkins M.D. On: 01/29/2023 14:44  Complexity Note: Imaging results reviewed.                         ROS  Cardiovascular: No reported cardiovascular signs or symptoms such as High blood pressure, coronary artery disease, abnormal heart rate or rhythm, heart attack, blood thinner therapy or heart weakness and/or failure Pulmonary or Respiratory: No reported pulmonary signs or symptoms such as wheezing and difficulty taking a deep full breath (Asthma), difficulty blowing air  out (Emphysema), coughing up mucus (Bronchitis), persistent dry cough, or temporary stoppage of breathing during sleep Neurological: No reported neurological signs or symptoms such as seizures, abnormal skin sensations, urinary and/or fecal incontinence, being born with an abnormal open spine and/or a tethered spinal cord Psychological-Psychiatric: No reported psychological or psychiatric signs or symptoms such as difficulty sleeping, anxiety, depression, delusions or hallucinations (schizophrenial), mood swings (bipolar disorders) or suicidal ideations or attempts Gastrointestinal: No reported gastrointestinal signs or symptoms such as vomiting or evacuating blood, reflux, heartburn, alternating episodes of diarrhea and constipation, inflamed or scarred liver, or pancreas or irrregular and/or infrequent bowel movements Genitourinary: No reported renal or genitourinary signs or symptoms such as difficulty voiding or producing urine, peeing blood, non-functioning kidney, kidney stones, difficulty emptying the bladder, difficulty controlling the flow of urine, or chronic kidney disease Hematological: No reported hematological signs or symptoms such as prolonged bleeding, low or poor functioning platelets, bruising or bleeding easily, hereditary bleeding problems, low energy levels due to low hemoglobin or being anemic Endocrine: No reported endocrine signs or symptoms such as high or low blood sugar, rapid heart rate due to high thyroid levels, obesity or weight gain due to slow thyroid or thyroid disease Rheumatologic: No reported rheumatological signs and symptoms such as fatigue, joint pain, tenderness, swelling, redness, heat, stiffness, decreased range of motion, with or without associated rash Musculoskeletal: Negative for myasthenia gravis, muscular dystrophy, multiple sclerosis or malignant hyperthermia Work History: Out of work due to pain  Allergies  Maxwell [name] has no known  allergies.  Laboratory Chemistry Profile   Renal Lab Results  Component Value Date   BUN 15 08/30/2019   CREATININE 1.37 (H) 08/30/2019   GFRAA >60 08/30/2019   GFRNONAA 58 (L) 08/30/2019   PROTEINUR NEGATIVE 08/30/2019     Electrolytes Lab Results  Component Value Date   NA  137 08/30/2019   K 3.5 08/30/2019   CL 104 08/30/2019   CALCIUM 9.1 08/30/2019     Hepatic No results found for: AST, ALT, ALBUMIN, ALKPHOS, AMYLASE, LIPASE, AMMONIA   ID Lab Results  Component Value Date   SARSCOV2NAA NEGATIVE 09/01/2019   STAPHAUREUS NEGATIVE 08/30/2019   MRSAPCR NEGATIVE 08/30/2019     Bone No results found for: VD25OH, VD125OH2TOT, CI6874NY7, CI7874NY7, 25OHVITD1, 25OHVITD2, 25OHVITD3, TESTOFREE, TESTOSTERONE   Endocrine Lab Results  Component Value Date   GLUCOSE 118 (H) 08/30/2019   GLUCOSEU NEGATIVE 08/30/2019     Neuropathy No results found for: VITAMINB12, FOLATE, HGBA1C, HIV   CNS No results found for: COLORCSF, APPEARCSF, RBCCOUNTCSF, WBCCSF, POLYSCSF, LYMPHSCSF, EOSCSF, PROTEINCSF, GLUCCSF, JCVIRUS, CSFOLI, IGGCSF, LABACHR, ACETBL   Inflammation (CRP: Acute  ESR: Chronic) No results found for: CRP, ESRSEDRATE, LATICACIDVEN   Rheumatology No results found for: RF, ANA, LABURIC, URICUR, LYMEIGGIGMAB, LYMEABIGMQN, HLAB27   Coagulation Lab Results  Component Value Date   INR 1.0 08/30/2019   LABPROT 13.2 08/30/2019   APTT 30 08/30/2019   PLT 329 08/30/2019     Cardiovascular Lab Results  Component Value Date   HGB 14.8 08/30/2019   HCT 44.4 08/30/2019     Screening Lab Results  Component Value Date   SARSCOV2NAA NEGATIVE 09/01/2019   STAPHAUREUS NEGATIVE 08/30/2019   MRSAPCR NEGATIVE 08/30/2019     Cancer No results found for: CEA, CA125, LABCA2   Allergens No results found for: ALMOND, APPLE, ASPARAGUS, AVOCADO, BANANA, BARLEY, BASIL,  BAYLEAF, GREENBEAN, LIMABEAN, WHITEBEAN, BEEFIGE, REDBEET, BLUEBERRY, BROCCOLI, CABBAGE, MELON, CARROT, CASEIN, CASHEWNUT, CAULIFLOWER, CELERY     Note: Lab results reviewed.  PFSH  Drug: Maxwell [name]  reports no history of drug use. Alcohol:  reports current alcohol use of about 2.0 standard drinks of alcohol per week. Tobacco:  reports that he has been smoking cigars. He has quit using smokeless tobacco.  His smokeless tobacco use included snuff. Medical:  has a past medical history of Bulging lumbar disc, Chronic pain, DDD (degenerative disc disease), lumbar, and History of kidney stones. Family: Family history is unknown by patient.  Past Surgical History:  Procedure Laterality Date   LUMBAR LAMINECTOMY/DECOMPRESSION MICRODISCECTOMY Right 05/28/2017   Procedure: LUMBAR LAMINECTOMY/DECOMPRESSION MICRODISCECTOMY 1 LEVEL-L4-5;  Surgeon: Clois Fret, MD;  Location: [facility];  Service: Neurosurgery;  Laterality: Right;   SPINAL CORD STIMULATOR TRIAL Bilateral 09/06/2019   Procedure: THORACIC SPINAL CORD STIMULATOR PERCUTANEOUS TRIAL;  Surgeon: Bluford Standing, MD;  Location: [facility];  Service: Neurosurgery;  Laterality: Bilateral;   Active Ambulatory Problems    Diagnosis Date Noted   Chronic pain syndrome 10/08/2018   Failed back surgical syndrome 10/08/2018   Lumbar post-laminectomy syndrome 10/08/2018   S/P lumbar microdiscectomy 11/19/2018   Lumbar spondylosis 11/19/2018   Degenerative disc disease at L5-S1 level 01/29/2017   Resolved Ambulatory Problems    Diagnosis Date Noted   No Resolved Ambulatory Problems   Past Medical History:  Diagnosis Date   Bulging lumbar disc    Chronic pain    DDD (degenerative disc disease), lumbar    History of kidney stones    Constitutional Exam  General appearance: Well nourished, well developed, and well hydrated. In no apparent acute distress Vitals:   02/24/24 0850  BP: 119/81  Pulse: 72  Resp: 14   Temp: 98.2 F (36.8 C)  TempSrc: Temporal  SpO2: 99%  Weight: 181 lb (82.1 kg)  Height: 5' 10 (1.778 m)   BMI Assessment: Estimated body mass  index is 25.97 kg/m as calculated from the following:   Height as of this encounter: 5' 10 (1.778 m).   Weight as of this encounter: 181 lb (82.1 kg).  BMI interpretation table: BMI level Category Range association with higher incidence of chronic pain  <18 kg/m2 Underweight   18.5-24.9 kg/m2 Ideal body weight   25-29.9 kg/m2 Overweight Increased incidence by 20%  30-34.9 kg/m2 Obese (Class I) Increased incidence by 68%  35-39.9 kg/m2 Severe obesity (Class II) Increased incidence by 136%  >40 kg/m2 Extreme obesity (Class III) Increased incidence by 254%   Patient's current BMI Ideal Body weight  Body mass index is 25.97 kg/m. Ideal body weight: 73 kg (160 lb 15 oz) Adjusted ideal body weight: 76.6 kg (168 lb 15.4 oz)   BMI Readings from Last 4 Encounters:  02/24/24 25.97 kg/m  01/28/24 25.97 kg/m  12/30/23 26.01 kg/m  12/08/23 25.11 kg/m   Wt Readings from Last 4 Encounters:  02/24/24 181 lb (82.1 kg)  01/28/24 181 lb (82.1 kg)  12/30/23 181 lb 4 oz (82.2 kg)  12/08/23 175 lb (79.4 kg)    Psych/Mental status: Alert, oriented x 3 (person, place, & time)       Eyes: PERLA Respiratory: No evidence of acute respiratory distress Right lower back pain Assessment  Primary Diagnosis & Pertinent Problem List: The primary encounter diagnosis was Lumbar post-laminectomy syndrome. Diagnoses of Failed back surgical syndrome and Chronic pain syndrome were also pertinent to this visit.  Visit Diagnosis (New problems to examiner): 1. Lumbar post-laminectomy syndrome   2. Failed back surgical syndrome   3. Chronic pain syndrome    Plan of Care (Initial workup plan)  The patient is a 59 year old male with chronic right-sided axial low back pain status post prior right L4-L5 microdiscectomy and prior percutaneous spinal cord stimulator  trial with [place] in 2021, presenting for evaluation of persistent symptoms and consideration of neuromodulation. He denies current radicular pain and describes pain localized to the right lower back, consistent with post-laminectomy syndrome and chronic axial mechanical low back pain. He currently uses methocarbamol  as needed with limited benefit.  We discussed neuromodulation options in detail, including SPRINT peripheral nerve stimulation (PNS) targeting the lumbar medial branch nerves for axial low back pain, as well as repeat percutaneous spinal cord stimulator trial. Risks, benefits, alternatives, and expected procedural requirements for both approaches were reviewed. The patient stated clearly that he does not do needles and would only consider proceeding under general anesthesia.   He was informed that general anesthesia is not offered for percutaneous PNS or spinal cord stimulator trials at our facility, and that these procedures are typically performed with local anesthesia with or without moderate sedation to allow for patient safety and intra-procedural feedback. Despite discussion that moderate sedation could be used to improve comfort, he remained adamantly opposed.  If the patient is adamant on doing this with general anesthesia, paddle trial could be considered.  He was advised that this would require a surgical evaluation and discussion with his spine surgeon, Dr. Katrina, to determine candidacy and feasibility for operative paddle lead placement and trial. At this time, no interventional procedures were scheduled.  He was advised to continue current medications as prescribed and to follow up with neurosurgery if he wishes to pursue a surgical neuromodulation approach. We remain available for further discussion should he reconsider percutaneous options in the future.  Provider-requested follow-up: No follow-ups on file.  I personally spent a total of 40 minutes in the care  of the  patient today including preparing to see the patient, getting/reviewing separately obtained history, performing a medically appropriate exam/evaluation, counseling and educating, placing orders, and documenting clinical information in the EHR.   Note by: Wallie Sherry, MD (TTS and AI technology used. I apologize for any typographical errors that were not detected and corrected.) Date: 02/24/2024; Time: 12:08 PM     [1]  Current Outpatient Medications:    methocarbamol  (ROBAXIN ) 750 MG tablet, TAKE 1 TABLET BY MOUTH EVERY 8 (EIGHT) HOURS AS NEEDED FOR MUSCLE SPASMS. THIS CAN MAKE YOU SLEEPY., Disp: 60 tablet, Rfl: 0   Multiple Vitamins-Minerals (MULTIVITAMIN PO), Take 10 tablets by mouth daily. , Disp: , Rfl:    tadalafil  (CIALIS ) 20 MG tablet, TAKE 1 TABLET BY MOUTH AS NEEDED, Disp: 18 tablet, Rfl: 11

## 2024-02-28 ENCOUNTER — Other Ambulatory Visit: Payer: Self-pay | Admitting: Physician Assistant

## 2024-02-28 DIAGNOSIS — M545 Low back pain, unspecified: Secondary | ICD-10-CM

## 2024-02-28 DIAGNOSIS — Z9889 Other specified postprocedural states: Secondary | ICD-10-CM

## 2024-03-11 ENCOUNTER — Other Ambulatory Visit: Payer: Self-pay

## 2024-03-11 ENCOUNTER — Ambulatory Visit: Admitting: Neurosurgery

## 2024-03-11 ENCOUNTER — Encounter: Payer: Self-pay | Admitting: Neurosurgery

## 2024-03-11 VITALS — BP 132/86 | Ht 70.0 in | Wt 186.6 lb

## 2024-03-11 DIAGNOSIS — Z01818 Encounter for other preprocedural examination: Secondary | ICD-10-CM

## 2024-03-11 DIAGNOSIS — M961 Postlaminectomy syndrome, not elsewhere classified: Secondary | ICD-10-CM

## 2024-03-11 NOTE — Addendum Note (Signed)
 Addended by: Eliaz Fout on: 03/11/2024 11:42 AM   Modules accepted: Orders

## 2024-03-11 NOTE — Patient Instructions (Addendum)
 Please see below for information in regards to your upcoming surgery:   Planned surgery: 1) thoracic laminectomy for spinal cord stimulator open paddle trial; 2) internal pulse generator placement [employer]   Surgery date: 04/05/24 & 04/12/24 at [facility] (Medical Mall: [address] [place], [place] [address]) - you will find out your arrival time the business day before your surgery.   Pre-op appointment at [facility] Pre-admit Testing: you will receive a call with a date/time for this appointment. If you are scheduled for an in person appointment, Pre-admit Testing is located on the first floor of the [facility] building, [address], [address]. During this appointment, they will advise you which medications you can take the morning of surgery, and which medications you will need to hold for surgery. Labs (such as blood work, EKG) may be done at your pre-op appointment. You are not required to fast for these labs. Should you need to change your pre-op appointment, please call Pre-admit testing at [phone].   Please bring your medication bottles or an up to date medication list to your pre-admit testing appointment (regardless of whether we have a list in your chart).    Common restrictions after spine surgery: No bending, lifting, or twisting (BLT). Avoid lifting objects heavier than 10 pounds for the first 6 weeks after surgery. Where possible, avoid household activities that involve lifting, bending, reaching, pushing, or pulling such as laundry, vacuuming, grocery shopping, and childcare. Try to arrange for help from friends and family for these activities while you heal. Do not drive while taking prescription pain medication. Weeks 6 through 12 after surgery: avoid lifting more than 25 pounds.     How to contact us :  If you have any questions/concerns before or after surgery, you can reach us  at [phone], or you can send a mychart  message. We can be reached by phone or mychart 8am-4pm, Monday-Friday.  *Please note: Calls after 4pm are forwarded to a third party answering service. Mychart messages are not routinely monitored during evenings, weekends, and holidays. Please call our office to contact the answering service for urgent concerns during non-business hours.    If you have FMLA/disability paperwork, please drop it off or fax it to [phone]   Appointments/FMLA & disability paperwork: Reche Hait, & Nichole Registered Nurse/Surgery scheduler: Audric Venn, RN & Katie, RN Certified Medical Assistants: Don, CMA, Elenor, CMA, Damien, CMA, & Auston, [place] Physician Assistants: Lyle Decamp, PA-C, Edsel Goods, PA-C & Glade Boys, PA-C Surgeons: Penne Sharps, MD & Reeves Daisy, MD   [facility] PREADMIT TESTING VISIT and SURGERY INFORMATION SHEET   Now that surgery has been scheduled you can anticipate several phone calls from [facility] services. A pharmacy technician will call you to verify your current list of medications taken at home.               The Pre-Service Center will call to verify your insurance information and to give you billing estimates and information.             The Preadmit Testing Office will be calling to schedule a visit to obtain information for the anesthesia team and provide instructions on preparation for surgery.  What can you expect for the Preadmit Testing Visit: Appointments may be scheduled in-person or by telephone.  If a telephone visit is scheduled, you may be asked to come into the office to have lab tests or other studies performed.   This visit  will not be completed any greater than 14 days prior to your surgery.  If your surgery has been scheduled for a future date, please do not be alarmed if we have not contacted you to schedule an appointment more than a month prior to the surgery date.    Please be prepared to provide the following  information during this appointment:            -Personal medical history                                               -Medication and allergy list            -Any history of problems with anesthesia              -Recent lab work or diagnostic studies            -Please notify us  of any needs we should be aware of to provide the best care possible           -You will be provided with instructions on how to prepare for your surgery.    On The Day of Surgery:  You must have a driver to take you home after surgery, you will be asked not to drive for 24 hours following surgery.  Taxi, Gisele and non-medical transport will not be acceptable means of transportation unless you have a responsible individual who will be traveling with you.  Visitors in the surgical area:   2 people will be able to visit you in your room once your preparation for surgery has been completed. During surgery, your visitors will be asked to wait in the Surgery Waiting Area.  It is not a requirement for them to stay, if they prefer to leave and come back.  Your visitor(s) will be given an update once the surgery has been completed.  No visitors are allowed in the initial recovery room to respect patient privacy and safety.  Once you are more awake and transfer to the secondary recovery area, or are transferred to an inpatient room, visitors will again be able to see you.  To respect and protect your privacy: We will ask on the day of surgery who your driver will be and what the contact number for that individual will be. We will ask if it is okay to share information with this individual, or if there is an alternative individual that we, or the surgeon, should contact to provide updates and information. If family or friends come to the surgical information desk requesting information about you, who you have not listed with us , no information will be given.   It may be helpful to designate someone as the main contact who will  be responsible for updating your other friends and family.    PREADMIT TESTING OFFICE: [phone] SAME DAY SURGERY: [phone] We look forward to caring for you before and throughout the process of your surgery.

## 2024-03-11 NOTE — Progress Notes (Signed)
 "   Referring Physician:  No referring provider defined for this encounter.  Primary Physician:  No primary care provider on file.  History of Present Illness: 03/11/2024 Maxwell [name] presents today with continued back pain.  He has suffered from back pain since his back surgery in 2019.  04/15/2023 Maxwell [name] is here today with a chief complaint of right lower back pain.  We have been seeing him for several years with right lower back pain in a similar location just above into the right of his incision from his microdiscectomy.  He has tried physical therapy in addition to medication management for quite some time.  He has previously tried a spinal cord stimulator evaluation without improvement.    History of Present Illness: 04/02/2023 Note from Maxwell [name]  DOS: 05/28/17 - R L4-5 microdiscectomy by Maxwell [name] DOS: 09/06/19 - SCS trial with percutaneous lead by Maxwell [name]   Maxwell [name] has a history of chronic pain syndrome and ED.    Last seen by me on 03/06/23. He has known lumbar spondylosis and multilevel bilateral foraminal stenosis. Also with DDD L4-L5 and L5-S1.    LBP likely from underlying spondylosis/DDD.    Previous thoracic MRI looked good. Note made of disc at C6-C7- he has no neck or arm pain, no scapular pain.    MRI showed lesion at left iliac wing and MRI pelvis with and without contrast was recommended.    He is here to review that imaging. He was to continue with [name] for his lumbar spine.    He continues with constant right sided LBP with no leg pain. No numbness, tingling, or weakness. He has not see any significant improvement with over 6 weeks of [name]. This pain has been constant since his visit in December.    He has occasional cigar- maybe twice a month. Does not smoke cigarettes.    He tried to go back to work yesterday and was unable to do this due to pain.    No bowel or bladder issues.    Conservative measures:  Physical therapy: initial  eval with Maxwell [name] 02/17/23 and is still going  Multimodal medical therapy including regular antiinflammatories: robaxin , aleve Injections: no epidural steroid injections   Past Surgery:  05/28/17 - R L4-5 microdiscectomy by Maxwell [name] 09/06/19 - SCS trial with percutaneous lead by Maxwell [name] Maxwell [name] has no symptoms of cervical myelopathy.    The symptoms are causing a significant impact on the patient's life.   I have utilized the care everywhere function in epic to review the outside records available from external health systems.  Review of Systems:  A 10 point review of systems is negative, except for the pertinent positives and negatives detailed in the HPI.  Past Medical History: Past Medical History:  Diagnosis Date   Bulging lumbar disc    Chronic pain    low back   DDD (degenerative disc disease), lumbar    History of kidney stones     Past Surgical History: Past Surgical History:  Procedure Laterality Date   LUMBAR LAMINECTOMY/DECOMPRESSION MICRODISCECTOMY Right 05/28/2017   Procedure: LUMBAR LAMINECTOMY/DECOMPRESSION MICRODISCECTOMY 1 LEVEL-L4-5;  Surgeon: Maxwell Fret, MD;  Location: [facility] ORS;  Service: Neurosurgery;  Laterality: Right;   SPINAL CORD STIMULATOR TRIAL Bilateral 09/06/2019   Procedure: THORACIC SPINAL CORD STIMULATOR PERCUTANEOUS TRIAL;  Surgeon: Maxwell Standing, MD;  Location: [facility] ORS;  Service: Neurosurgery;  Laterality: Bilateral;    Allergies: Allergies as of 03/11/2024   (  No Known Allergies)    Medications:  Current Outpatient Medications:    methocarbamol  (ROBAXIN ) 750 MG tablet, TAKE 1 TABLET BY MOUTH EVERY 8 (EIGHT) HOURS AS NEEDED FOR MUSCLE SPASMS. THIS CAN MAKE YOU SLEEPY., Disp: 60 tablet, Rfl: 0   Multiple Vitamins-Minerals (MULTIVITAMIN PO), Take 10 tablets by mouth daily. , Disp: , Rfl:    tadalafil  (CIALIS ) 20 MG tablet, TAKE 1 TABLET BY MOUTH AS NEEDED, Disp: 18 tablet, Rfl: 11  Social History: Social History    Tobacco Use   Smoking status: Some Days    Types: Cigars   Smokeless tobacco: Former    Types: Snuff   Tobacco comments:    counseled concerning meds and patches  Vaping Use   Vaping status: Never Used  Substance Use Topics   Alcohol use: Yes    Alcohol/week: 2.0 standard drinks of alcohol    Types: 2 Cans of beer per week   Drug use: No    Family Medical History: Family History  Family history unknown: Yes    Physical Examination: Vitals:   03/11/24 1016  BP: 132/86    General: Patient is in no apparent distress. Attention to examination is appropriate.  Neck:   Supple.  Full range of motion.  Respiratory: Patient is breathing without any difficulty.   NEUROLOGICAL:     Awake, alert, oriented to person, place, and time.  Speech is clear and fluent.   Cranial Nerves: Pupils equal round and reactive to light.  Facial tone is symmetric.  Facial sensation is symmetric. Shoulder shrug is symmetric. Tongue protrusion is midline.  There is no pronator drift.  Strength: MAEW  Bilateral upper and lower extremity sensation is intact to light touch.    No evidence of dysmetria noted.  Gait is normal.     Medical Decision Making  Imaging: MRI L spine 02/24/2023 IMPRESSION: 1. Eccentric left disc bulge that results in mild narrowing of the left lateral recess, new/increased from 2020. 2. Moderate bilateral neural foraminal narrowing at L5-S1, unchanged. 3. Nonspecific T2 hyperintense lesion along the left iliac wing measuring 2.1 x 1.6 cm. This is only partially imaged in 2020, and is new compared to 2019. Recommend further evaluation with a pelvic MRI with and without contrast.     Electronically Signed   By: Maxwell [name] M.D.   On: 03/05/2023 10:15   L spine Flex Ext 01/21/2023 IMPRESSION: 1. No fracture or acute finding.  No malalignment. 2. Lower lumbar spine disc degenerative changes as detailed. No subluxation with flexion or extension.      Electronically Signed   By: Maxwell [name] M.D.   On: 01/29/2023 14:44  I have personally reviewed the images and agree with the above interpretation.  Assessment and Plan: Maxwell [name] is a pleasant 59 y.o. male with postlaminectomy syndrome.  He has no options for open surgical intervention in his lower back.  I think he is a good candidate for open paddle trial for a spinal cord stimulator.  I discussed the planned procedure at length with the patient, including the risks, benefits, alternatives, and indications. The risks discussed include but are not limited to bleeding, infection, need for reoperation, spinal fluid leak, stroke, vision loss, anesthetic complication, coma, paralysis, and even death. I also described in detail that improvement was not guaranteed.  The patient expressed understanding of these risks, and asked that we proceed with surgery. I described the surgery in layman's terms, and gave ample opportunity for questions, which were answered to  the best of my ability.  I spent a total of 15 minutes in this patient's care today. This time was spent reviewing pertinent records including imaging studies, obtaining and confirming history, performing a directed evaluation, formulating and discussing my recommendations, and documenting the visit within the medical record.        Thank you for involving me in the care of this patient.      Abayomi Pattison K. Clois MD, [facility] Neurosurgery  "

## 2024-03-11 NOTE — Addendum Note (Signed)
 Addended by: Heather Streeper on: 03/11/2024 11:15 AM   Modules accepted: Orders

## 2024-04-05 ENCOUNTER — Ambulatory Visit: Admit: 2024-04-05 | Admitting: Neurosurgery

## 2024-04-12 ENCOUNTER — Ambulatory Visit: Admit: 2024-04-12 | Admitting: Neurosurgery

## 2024-04-27 ENCOUNTER — Encounter: Admitting: Orthopedic Surgery

## 2024-05-20 ENCOUNTER — Encounter
